# Patient Record
Sex: Female | Born: 1950
Health system: Southern US, Community
[De-identification: ages and names within clinical notes are randomized; demographics above are authoritative.]

## PROBLEM LIST (undated history)

## (undated) DIAGNOSIS — E119 Type 2 diabetes mellitus without complications: Secondary | ICD-10-CM

## (undated) DIAGNOSIS — E7800 Pure hypercholesterolemia, unspecified: Secondary | ICD-10-CM

## (undated) DIAGNOSIS — M1990 Unspecified osteoarthritis, unspecified site: Secondary | ICD-10-CM

## (undated) DIAGNOSIS — I10 Essential (primary) hypertension: Secondary | ICD-10-CM

## (undated) HISTORY — PX: COLONOSCOPY: SHX174

## (undated) HISTORY — PX: CLOSED REDUCTION / MANIPULATION JOINT: SUR207

---

## 2001-10-12 ENCOUNTER — Other Ambulatory Visit: Admission: RE | Admit: 2001-10-12 | Discharge: 2001-10-12 | Payer: Self-pay | Admitting: Obstetrics and Gynecology

## 2002-11-20 ENCOUNTER — Other Ambulatory Visit: Admission: RE | Admit: 2002-11-20 | Discharge: 2002-11-20 | Payer: Self-pay | Admitting: Obstetrics and Gynecology

## 2002-11-22 ENCOUNTER — Encounter: Admission: RE | Admit: 2002-11-22 | Discharge: 2002-11-22 | Payer: Self-pay | Admitting: Family Medicine

## 2002-11-22 ENCOUNTER — Encounter: Payer: Self-pay | Admitting: Family Medicine

## 2002-12-19 ENCOUNTER — Encounter: Payer: Self-pay | Admitting: Obstetrics and Gynecology

## 2002-12-19 ENCOUNTER — Ambulatory Visit (HOSPITAL_COMMUNITY): Admission: RE | Admit: 2002-12-19 | Discharge: 2002-12-19 | Payer: Self-pay | Admitting: Obstetrics and Gynecology

## 2004-12-17 ENCOUNTER — Emergency Department (HOSPITAL_COMMUNITY): Admission: EM | Admit: 2004-12-17 | Discharge: 2004-12-18 | Payer: Self-pay | Admitting: Emergency Medicine

## 2007-02-16 ENCOUNTER — Emergency Department: Payer: Self-pay | Admitting: Emergency Medicine

## 2007-02-16 IMAGING — US ABDOMEN ULTRASOUND
1 series · 17 of 25 positions shown · non-contrast
Comparison: none

REASON FOR EXAM: ruq pain vomiting
COMMENTS:

PROCEDURE:     US  - US ABDOMEN GENERAL SURVEY  - [DATE] [DATE]
RESULT:     Comparison: No available comparison exam.

[Series 1: abdomen ultrasound · 17 of 54 slices shown]
[im 1/54]
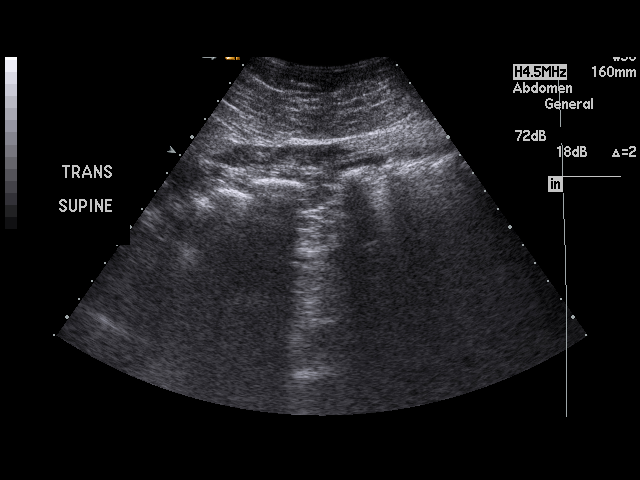
[im 5/54]
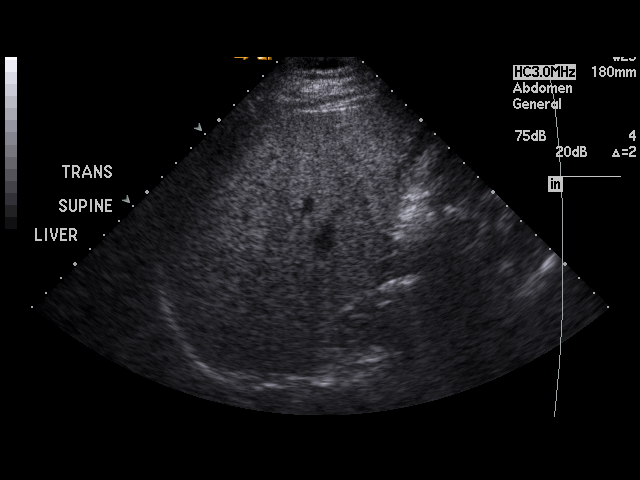
[im 7/54]
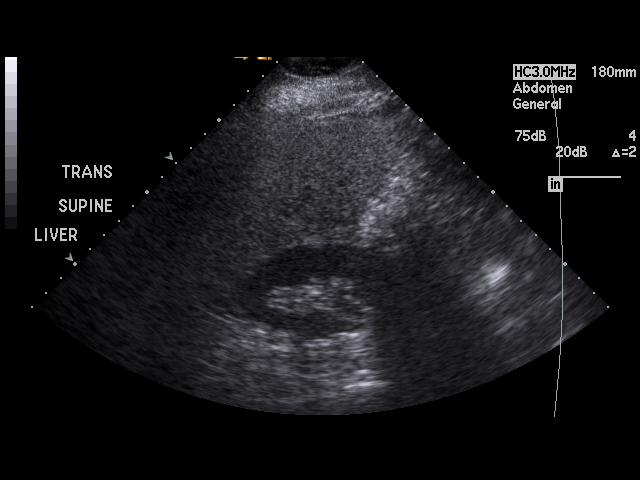
[im 12/54]
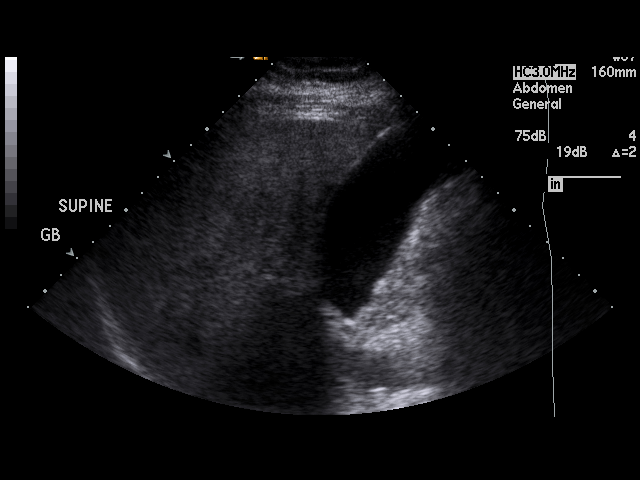
[im 14/54]
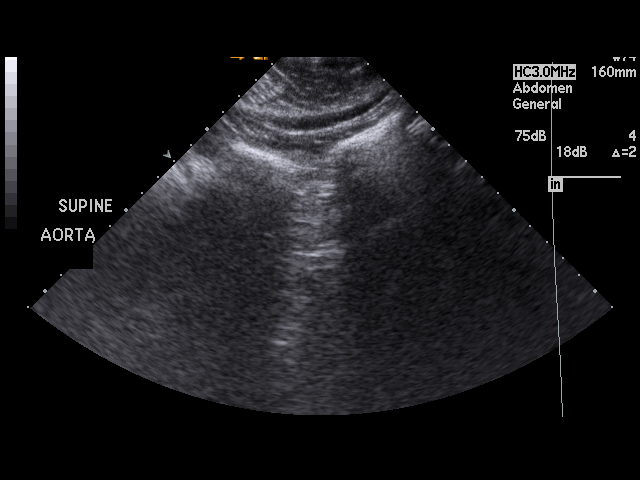
[im 18/54]
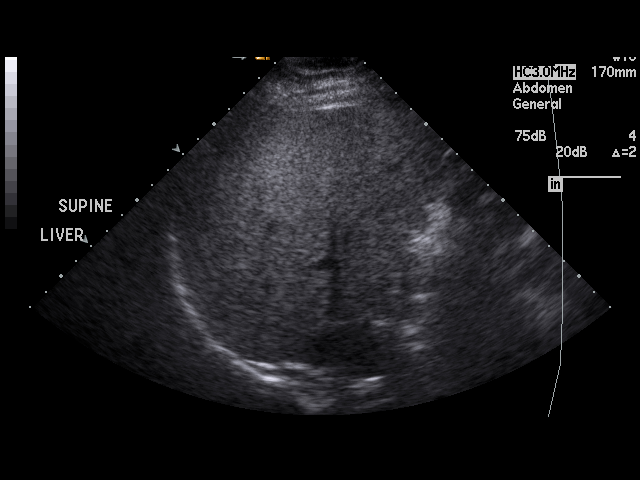
[im 20/54]
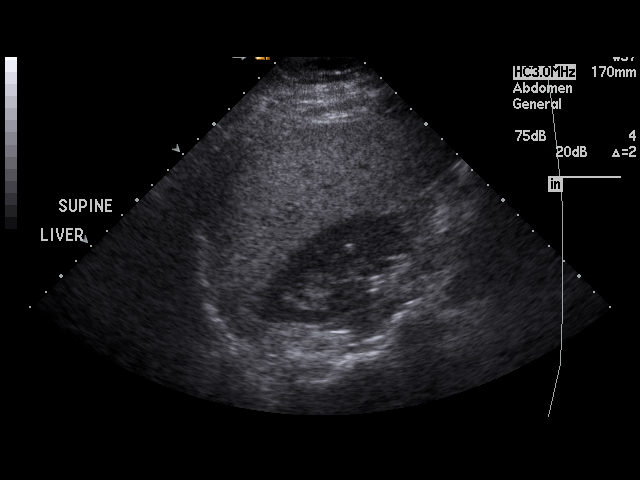
[im 25/54]
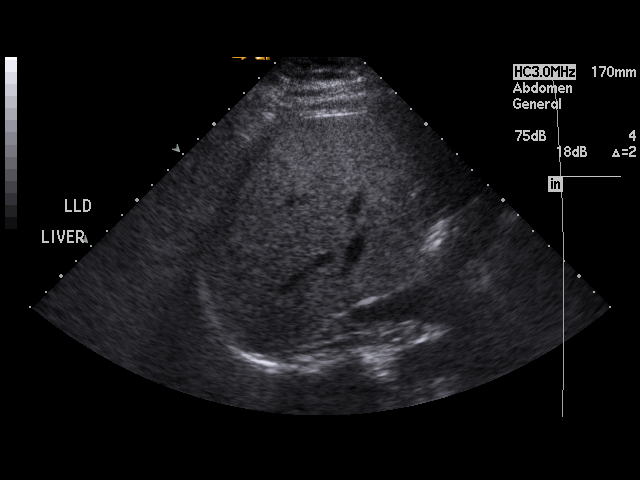
[im 27/54]
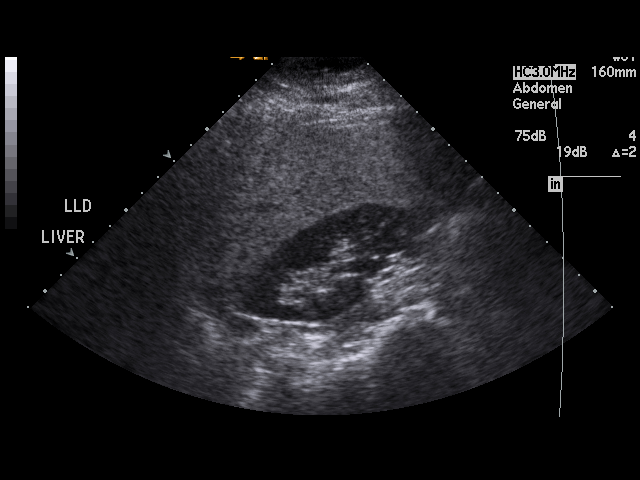
[im 29/54]
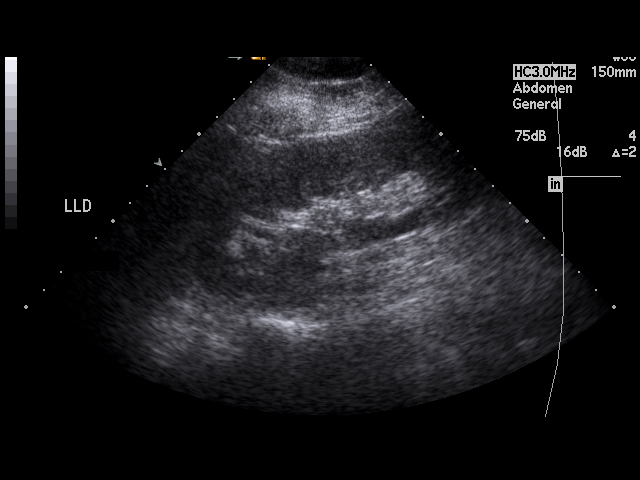
[im 34/54]
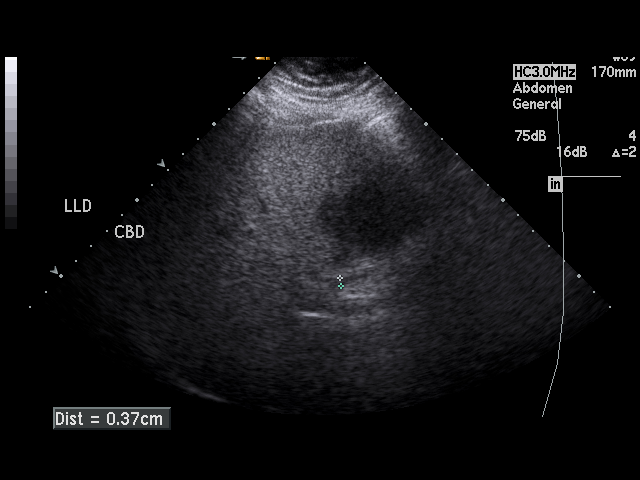
[im 36/54]
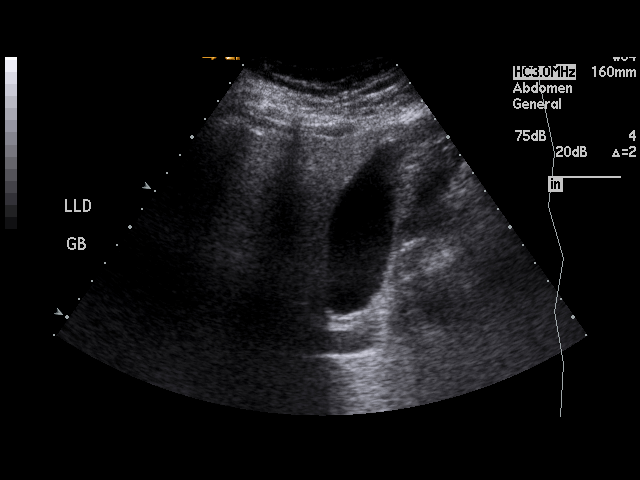
[im 40/54]
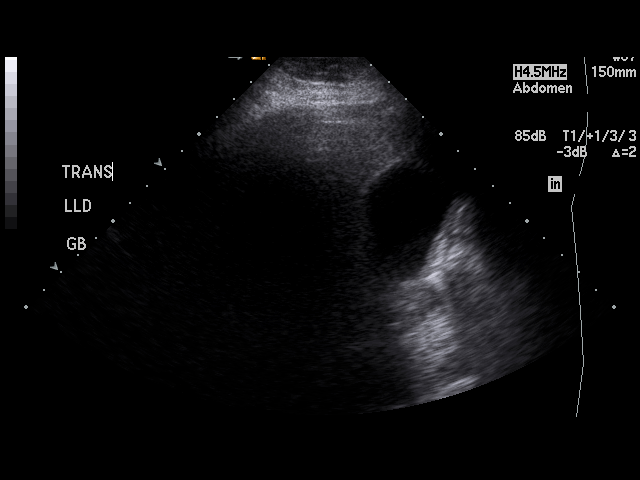
[im 42/54]
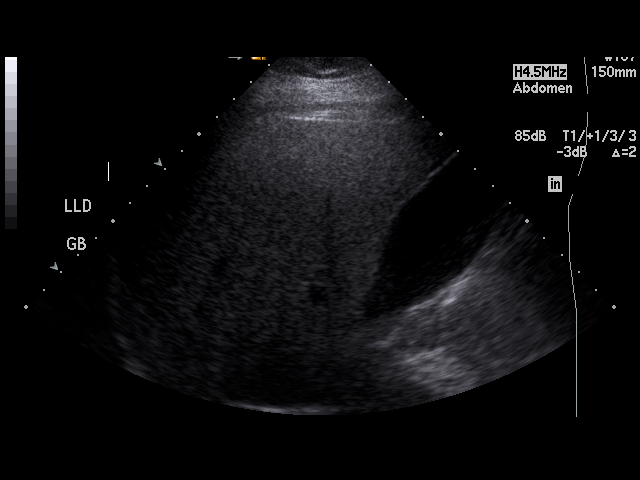
[im 47/54]
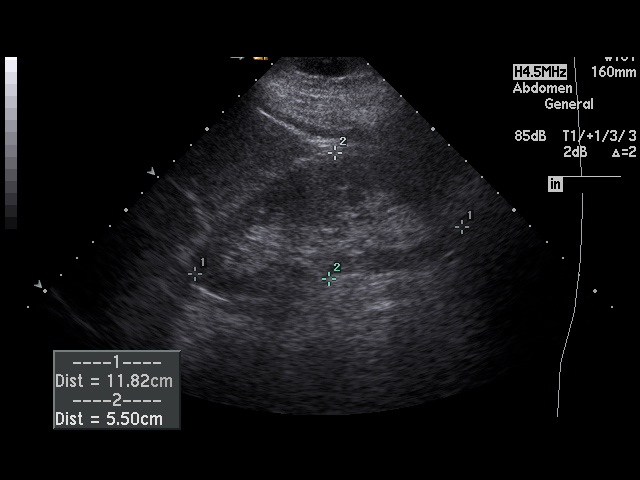
[im 49/54]
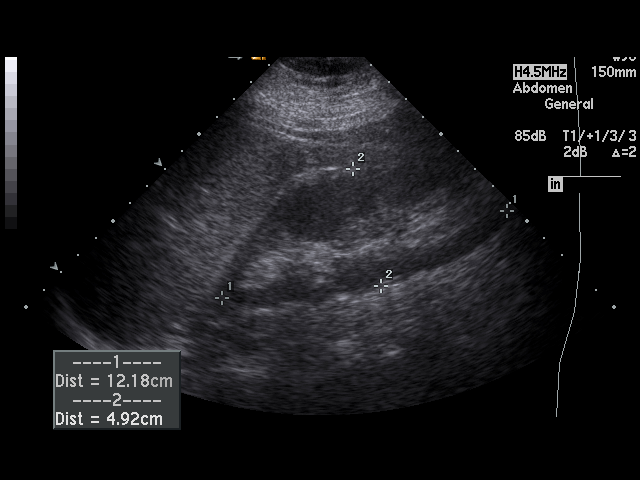
[im 54/54]
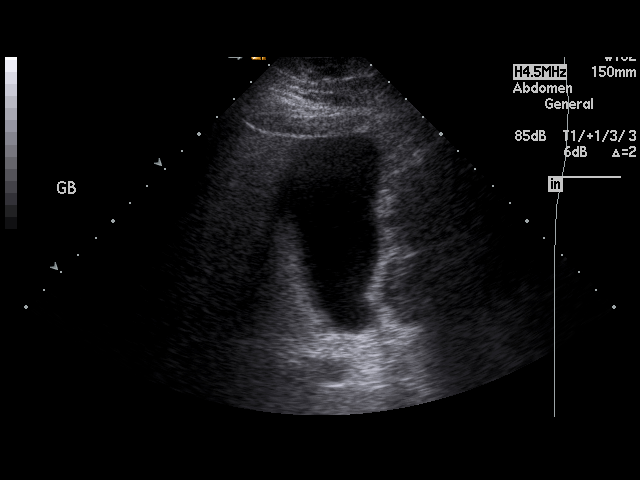

[17 of 25 positions shown; findings below may reference images not displayed]

FINDINGS: The liver is diffusely increased in echogenicity, suggestive of fatty
infiltration. No gross focal mass is noted. There is no intra or
extrahepatic bile duct dilatation. The common duct measures 4 mm in
diameter. The gallbladder is unremarkable. There is no sonographic Murphy's
sign. The main portal vein is patent with hepatopedal flow.

The pancreas is not well visualized. The right kidney measures 12.0 cm and
the left measures 12.8 cm in length. There is normal corticomedullary
differentiation. No definite renal stone or mass is noted. There is no
hydronephrosis. The spleen measures 10.8 cm in length. The abdominal aorta
is not well visualized.
IMPRESSION: 1. The pancreas and abdominal aorta are not well visualized.
2. Diffuse fatty infiltration of the liver.

Preliminary report was faxed to the emergency room by the night radiologist
shortly after the study was performed.

## 2007-03-04 ENCOUNTER — Ambulatory Visit (HOSPITAL_COMMUNITY): Admission: RE | Admit: 2007-03-04 | Discharge: 2007-03-04 | Payer: Self-pay | Admitting: Internal Medicine

## 2007-06-04 ENCOUNTER — Ambulatory Visit: Payer: Self-pay | Admitting: Orthopaedic Surgery

## 2008-07-03 ENCOUNTER — Ambulatory Visit: Payer: Self-pay | Admitting: Internal Medicine

## 2009-03-07 IMAGING — MG MAM DGTL SCREENING MAMMO W/CAD
1 series · 4 of 4 positions shown · non-contrast
Comparison: none

REASON FOR EXAM: scr mammo
COMMENTS:

[R CC · right · 4 of 4 slices shown]
[im 1/4]
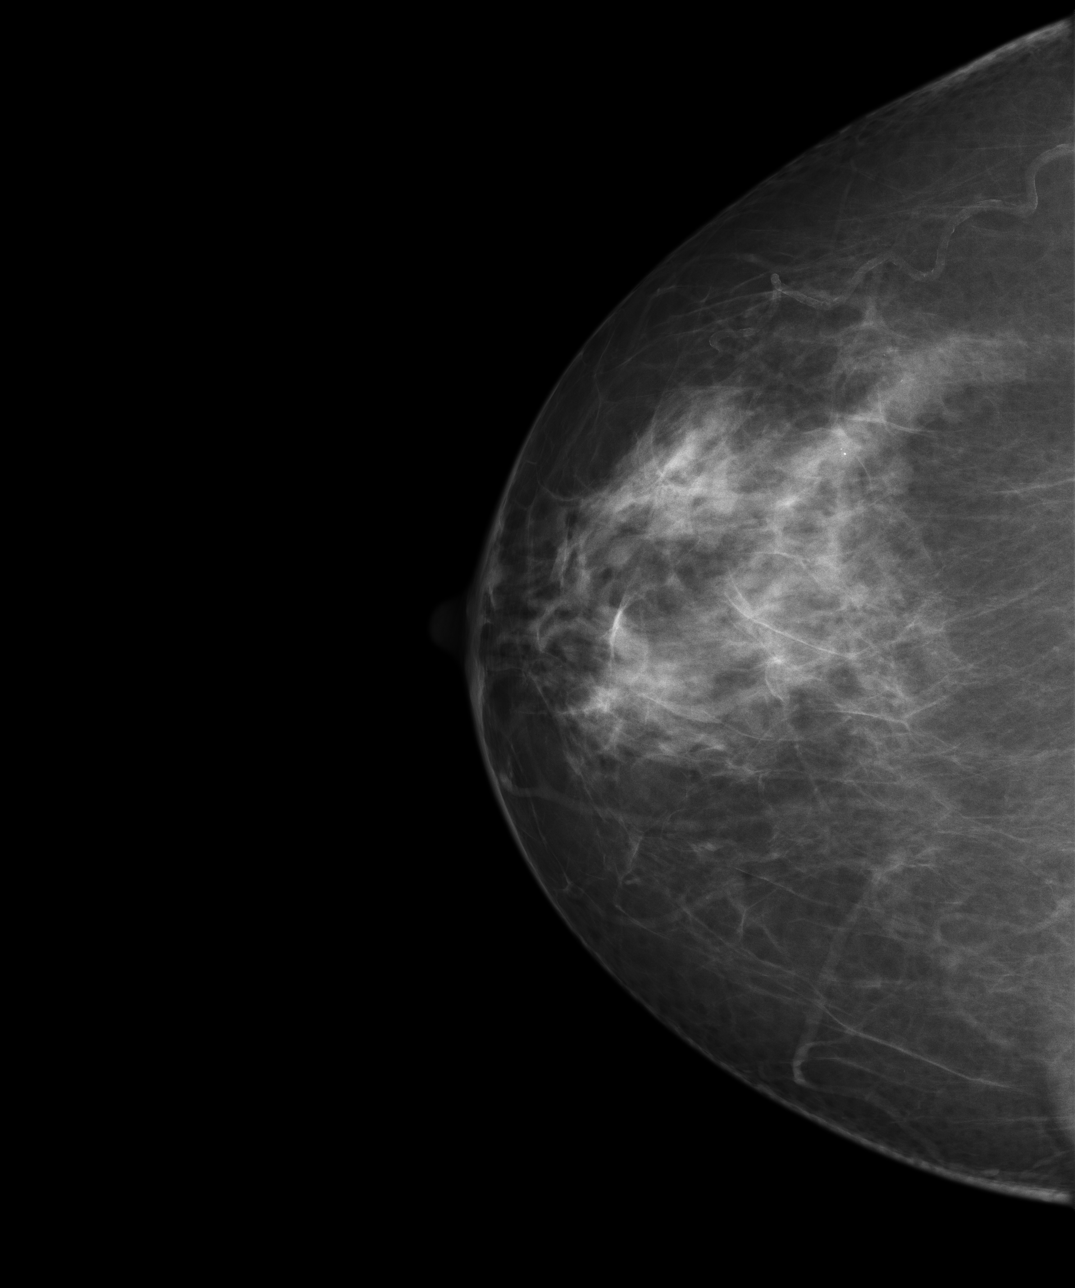
[im 2/4]
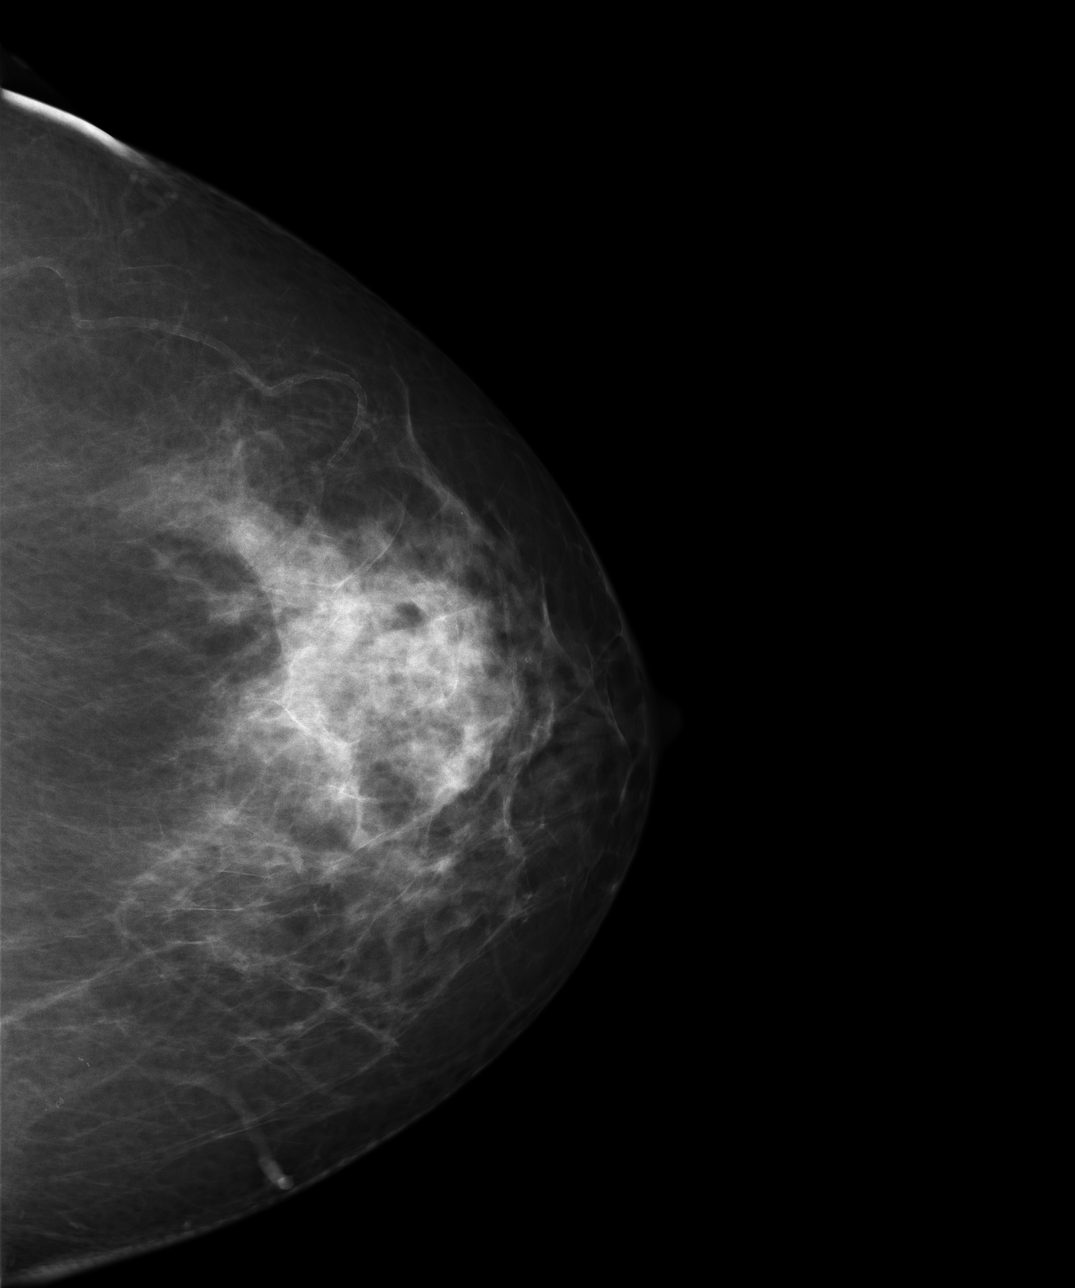
[im 3/4]
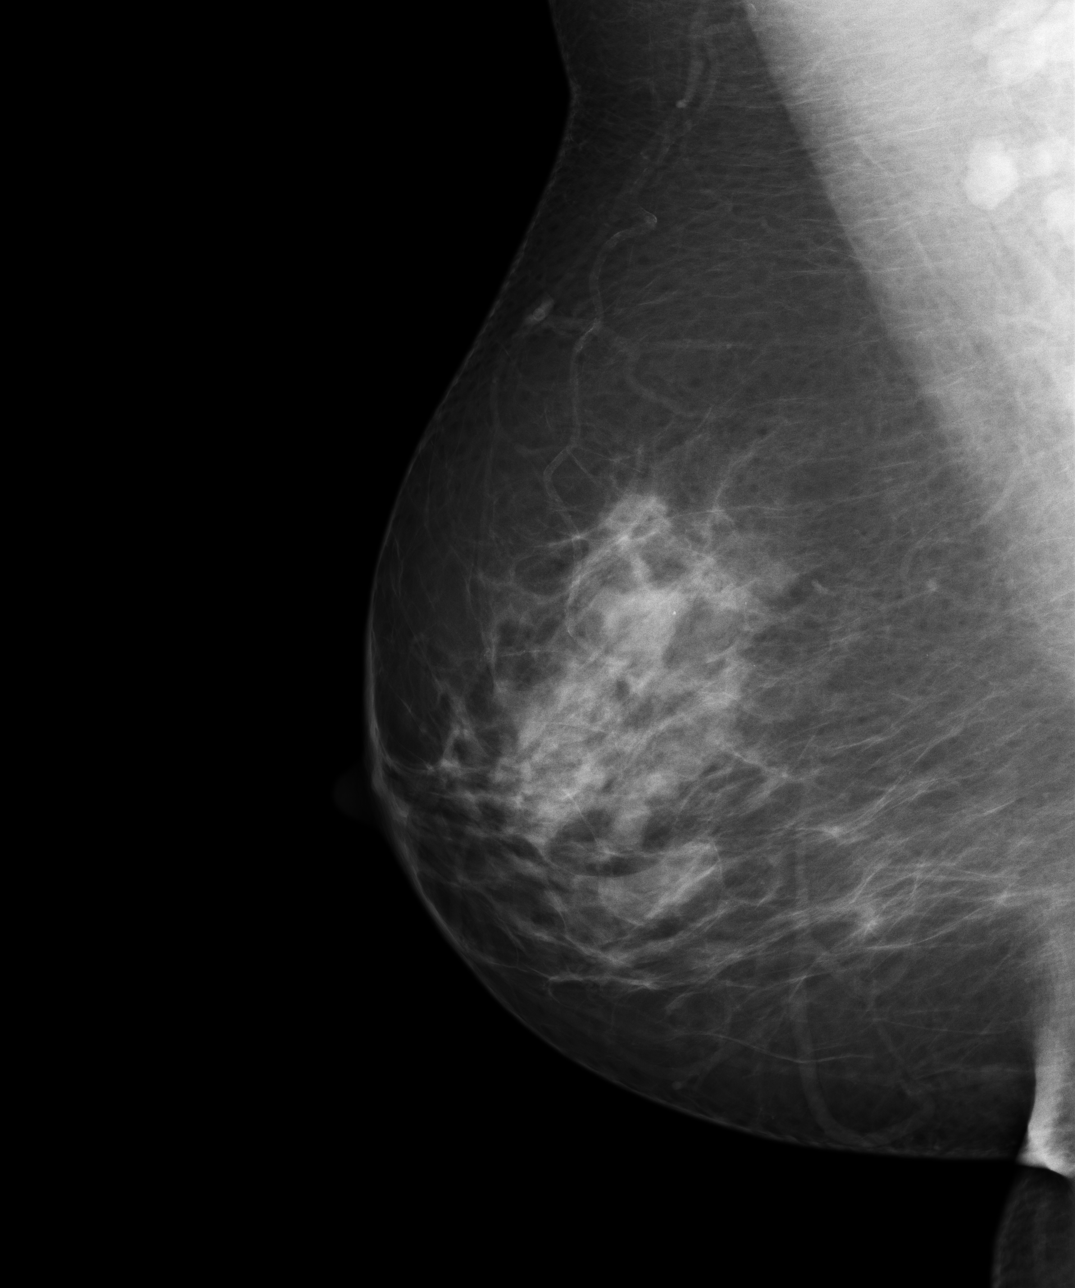
[im 4/4]
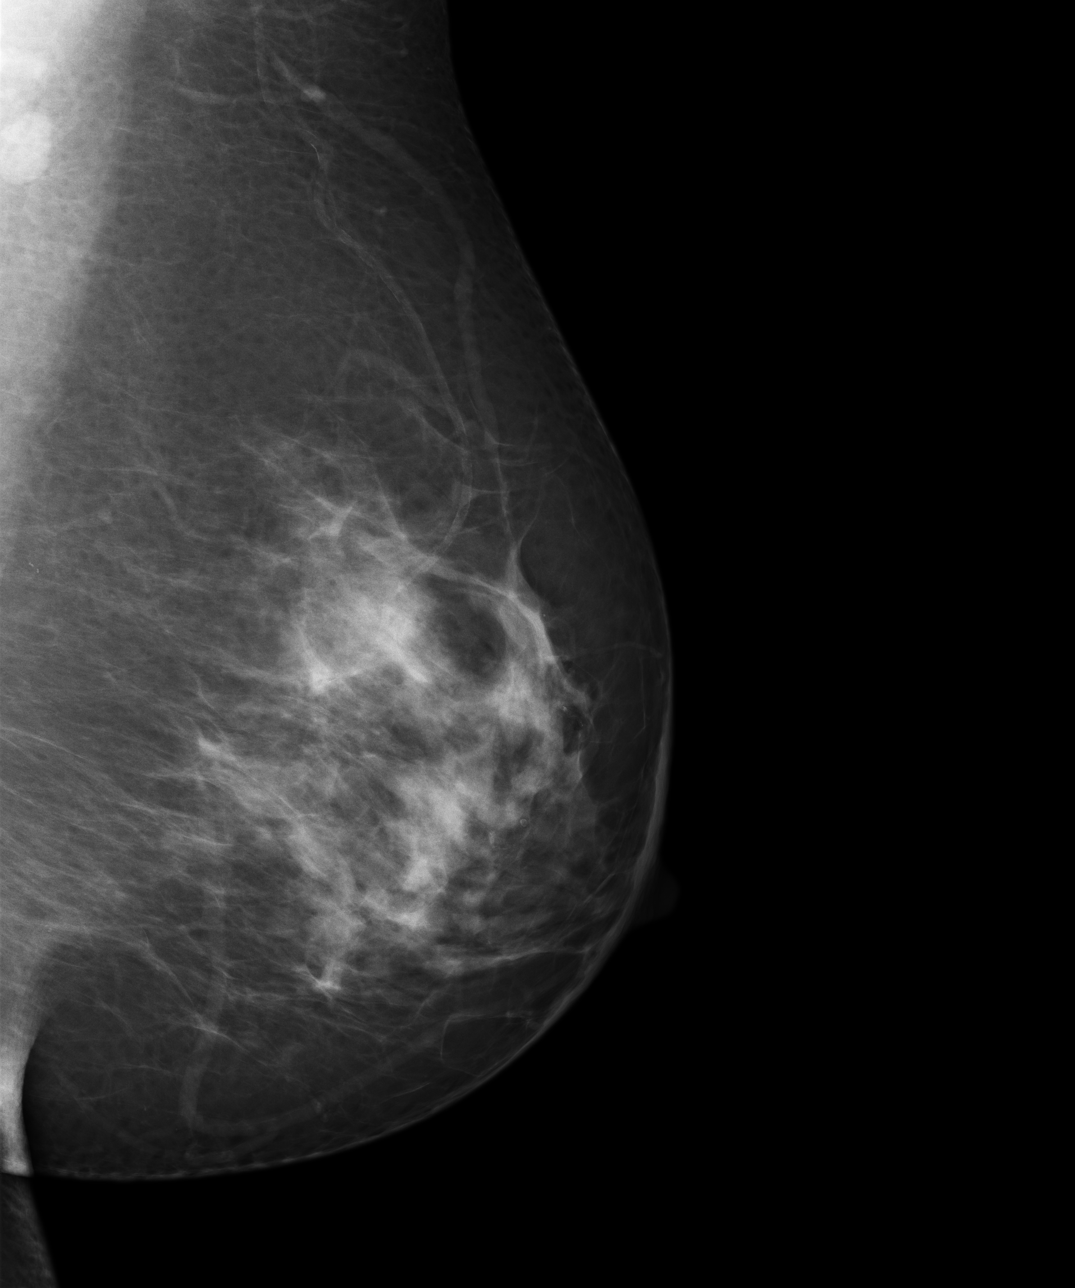

[4 of 4 positions shown; findings below may reference images not displayed]

PROCEDURE:     MAM - MAM DGTL SCREENING MAMMO W/CAD  - July 03, 2008  [DATE]

RESULT:        Comparison is made to the film screen images dated 12/19/02
from [REDACTED] and to digital images of 03/04/07.

The breasts exhibit a moderately dense parenchymal pattern. There is no
evidence of an area of developing density, dominant mass or
malignant-appearing calcification.  The appearance  is stable.
IMPRESSION: 1.     Stable, benign-appearing bilateral mammogram.
2.     BI-RADS:  Category 2-Benign Finding.
3.     Please continue to encourage annual mammographic followup and monthly
breast self exam.

A NEGATIVE MAMMOGRAM REPORT DOES NOT PRECLUDE BIOPSY OR OTHER EVALUATION OF
A CLINICALLY PALPABLE OR OTHERWISE SUSPICIOUS MASS OR LESION.  BREAST CANCER
MAY NOT BE DETECTED BY MAMMOGRAPHY IN UP TO 10% OF CASES.

## 2009-04-23 ENCOUNTER — Ambulatory Visit: Payer: Self-pay | Admitting: Internal Medicine

## 2009-10-31 ENCOUNTER — Emergency Department: Payer: Self-pay | Admitting: Emergency Medicine

## 2010-06-15 ENCOUNTER — Encounter: Payer: Self-pay | Admitting: Internal Medicine

## 2017-01-08 DIAGNOSIS — I10 Essential (primary) hypertension: Secondary | ICD-10-CM | POA: Diagnosis not present

## 2017-01-08 DIAGNOSIS — E1165 Type 2 diabetes mellitus with hyperglycemia: Secondary | ICD-10-CM | POA: Diagnosis not present

## 2017-01-08 DIAGNOSIS — K219 Gastro-esophageal reflux disease without esophagitis: Secondary | ICD-10-CM | POA: Diagnosis not present

## 2017-01-08 DIAGNOSIS — E668 Other obesity: Secondary | ICD-10-CM | POA: Diagnosis not present

## 2017-01-08 DIAGNOSIS — Z79899 Other long term (current) drug therapy: Secondary | ICD-10-CM | POA: Diagnosis not present

## 2017-01-08 DIAGNOSIS — E784 Other hyperlipidemia: Secondary | ICD-10-CM | POA: Diagnosis not present

## 2017-01-20 ENCOUNTER — Ambulatory Visit: Payer: Self-pay | Admitting: Family Medicine

## 2017-03-06 DIAGNOSIS — Z23 Encounter for immunization: Secondary | ICD-10-CM | POA: Diagnosis not present

## 2017-03-22 DIAGNOSIS — E7849 Other hyperlipidemia: Secondary | ICD-10-CM | POA: Diagnosis not present

## 2017-03-22 DIAGNOSIS — D259 Leiomyoma of uterus, unspecified: Secondary | ICD-10-CM | POA: Diagnosis present

## 2017-03-22 DIAGNOSIS — Z7984 Long term (current) use of oral hypoglycemic drugs: Secondary | ICD-10-CM | POA: Diagnosis not present

## 2017-03-22 DIAGNOSIS — D62 Acute posthemorrhagic anemia: Secondary | ICD-10-CM | POA: Diagnosis not present

## 2017-03-22 DIAGNOSIS — Z79899 Other long term (current) drug therapy: Secondary | ICD-10-CM | POA: Diagnosis not present

## 2017-03-22 DIAGNOSIS — K5751 Diverticulosis of both small and large intestine without perforation or abscess with bleeding: Secondary | ICD-10-CM | POA: Diagnosis not present

## 2017-03-22 DIAGNOSIS — K760 Fatty (change of) liver, not elsewhere classified: Secondary | ICD-10-CM | POA: Diagnosis not present

## 2017-03-22 DIAGNOSIS — E119 Type 2 diabetes mellitus without complications: Secondary | ICD-10-CM | POA: Diagnosis not present

## 2017-03-22 DIAGNOSIS — K922 Gastrointestinal hemorrhage, unspecified: Secondary | ICD-10-CM | POA: Diagnosis not present

## 2017-03-22 DIAGNOSIS — E785 Hyperlipidemia, unspecified: Secondary | ICD-10-CM | POA: Diagnosis present

## 2017-03-22 DIAGNOSIS — K5790 Diverticulosis of intestine, part unspecified, without perforation or abscess without bleeding: Secondary | ICD-10-CM | POA: Diagnosis not present

## 2017-03-22 DIAGNOSIS — Z87891 Personal history of nicotine dependence: Secondary | ICD-10-CM | POA: Diagnosis not present

## 2017-03-22 DIAGNOSIS — I10 Essential (primary) hypertension: Secondary | ICD-10-CM | POA: Diagnosis not present

## 2017-04-09 DIAGNOSIS — I10 Essential (primary) hypertension: Secondary | ICD-10-CM | POA: Diagnosis not present

## 2017-04-09 DIAGNOSIS — E119 Type 2 diabetes mellitus without complications: Secondary | ICD-10-CM | POA: Diagnosis not present

## 2017-04-09 DIAGNOSIS — K5730 Diverticulosis of large intestine without perforation or abscess without bleeding: Secondary | ICD-10-CM | POA: Diagnosis not present

## 2017-04-09 DIAGNOSIS — Z0000 Encounter for general adult medical examination without abnormal findings: Secondary | ICD-10-CM | POA: Diagnosis not present

## 2017-07-20 DIAGNOSIS — I10 Essential (primary) hypertension: Secondary | ICD-10-CM | POA: Diagnosis not present

## 2017-07-20 DIAGNOSIS — E119 Type 2 diabetes mellitus without complications: Secondary | ICD-10-CM | POA: Diagnosis not present

## 2017-07-20 DIAGNOSIS — K5730 Diverticulosis of large intestine without perforation or abscess without bleeding: Secondary | ICD-10-CM | POA: Diagnosis not present

## 2017-07-20 DIAGNOSIS — Z0000 Encounter for general adult medical examination without abnormal findings: Secondary | ICD-10-CM | POA: Diagnosis not present

## 2017-10-27 DIAGNOSIS — E119 Type 2 diabetes mellitus without complications: Secondary | ICD-10-CM | POA: Diagnosis not present

## 2017-10-27 DIAGNOSIS — E782 Mixed hyperlipidemia: Secondary | ICD-10-CM | POA: Diagnosis not present

## 2017-10-27 DIAGNOSIS — I10 Essential (primary) hypertension: Secondary | ICD-10-CM | POA: Diagnosis not present

## 2018-02-07 DIAGNOSIS — R69 Illness, unspecified: Secondary | ICD-10-CM | POA: Diagnosis not present

## 2018-02-09 DIAGNOSIS — I10 Essential (primary) hypertension: Secondary | ICD-10-CM | POA: Diagnosis not present

## 2018-02-09 DIAGNOSIS — S86211A Strain of muscle(s) and tendon(s) of anterior muscle group at lower leg level, right leg, initial encounter: Secondary | ICD-10-CM | POA: Diagnosis not present

## 2018-02-09 DIAGNOSIS — E782 Mixed hyperlipidemia: Secondary | ICD-10-CM | POA: Diagnosis not present

## 2018-02-09 DIAGNOSIS — E119 Type 2 diabetes mellitus without complications: Secondary | ICD-10-CM | POA: Diagnosis not present

## 2018-03-26 DIAGNOSIS — R69 Illness, unspecified: Secondary | ICD-10-CM | POA: Diagnosis not present

## 2018-05-04 DIAGNOSIS — Z0000 Encounter for general adult medical examination without abnormal findings: Secondary | ICD-10-CM | POA: Diagnosis not present

## 2018-05-04 DIAGNOSIS — M25461 Effusion, right knee: Secondary | ICD-10-CM | POA: Diagnosis not present

## 2018-05-04 DIAGNOSIS — I10 Essential (primary) hypertension: Secondary | ICD-10-CM | POA: Diagnosis not present

## 2018-05-04 DIAGNOSIS — E119 Type 2 diabetes mellitus without complications: Secondary | ICD-10-CM | POA: Diagnosis not present

## 2018-05-04 DIAGNOSIS — M170 Bilateral primary osteoarthritis of knee: Secondary | ICD-10-CM | POA: Diagnosis not present

## 2018-05-04 DIAGNOSIS — Z6835 Body mass index (BMI) 35.0-35.9, adult: Secondary | ICD-10-CM | POA: Diagnosis not present

## 2018-05-04 DIAGNOSIS — E782 Mixed hyperlipidemia: Secondary | ICD-10-CM | POA: Diagnosis not present

## 2018-05-04 DIAGNOSIS — S86211A Strain of muscle(s) and tendon(s) of anterior muscle group at lower leg level, right leg, initial encounter: Secondary | ICD-10-CM | POA: Diagnosis not present

## 2018-05-04 DIAGNOSIS — M25561 Pain in right knee: Secondary | ICD-10-CM | POA: Diagnosis not present

## 2018-05-04 DIAGNOSIS — M25562 Pain in left knee: Secondary | ICD-10-CM | POA: Diagnosis not present

## 2018-05-14 DIAGNOSIS — R69 Illness, unspecified: Secondary | ICD-10-CM | POA: Diagnosis not present

## 2018-09-08 DIAGNOSIS — I10 Essential (primary) hypertension: Secondary | ICD-10-CM | POA: Diagnosis not present

## 2018-09-08 DIAGNOSIS — E119 Type 2 diabetes mellitus without complications: Secondary | ICD-10-CM | POA: Diagnosis not present

## 2018-09-08 DIAGNOSIS — Z6836 Body mass index (BMI) 36.0-36.9, adult: Secondary | ICD-10-CM | POA: Diagnosis not present

## 2018-09-08 DIAGNOSIS — E782 Mixed hyperlipidemia: Secondary | ICD-10-CM | POA: Diagnosis not present

## 2018-10-03 DIAGNOSIS — R69 Illness, unspecified: Secondary | ICD-10-CM | POA: Diagnosis not present

## 2018-11-22 DIAGNOSIS — R69 Illness, unspecified: Secondary | ICD-10-CM | POA: Diagnosis not present

## 2018-11-23 DIAGNOSIS — R69 Illness, unspecified: Secondary | ICD-10-CM | POA: Diagnosis not present

## 2018-12-05 DIAGNOSIS — E782 Mixed hyperlipidemia: Secondary | ICD-10-CM | POA: Diagnosis not present

## 2018-12-05 DIAGNOSIS — Z6837 Body mass index (BMI) 37.0-37.9, adult: Secondary | ICD-10-CM | POA: Diagnosis not present

## 2018-12-05 DIAGNOSIS — I10 Essential (primary) hypertension: Secondary | ICD-10-CM | POA: Diagnosis not present

## 2018-12-05 DIAGNOSIS — E119 Type 2 diabetes mellitus without complications: Secondary | ICD-10-CM | POA: Diagnosis not present

## 2018-12-05 DIAGNOSIS — K921 Melena: Secondary | ICD-10-CM | POA: Diagnosis not present

## 2018-12-09 DIAGNOSIS — E119 Type 2 diabetes mellitus without complications: Secondary | ICD-10-CM | POA: Diagnosis not present

## 2018-12-09 DIAGNOSIS — E785 Hyperlipidemia, unspecified: Secondary | ICD-10-CM | POA: Diagnosis not present

## 2018-12-19 DIAGNOSIS — K921 Melena: Secondary | ICD-10-CM | POA: Insufficient documentation

## 2018-12-31 DIAGNOSIS — R69 Illness, unspecified: Secondary | ICD-10-CM | POA: Diagnosis not present

## 2019-01-24 DIAGNOSIS — Z6836 Body mass index (BMI) 36.0-36.9, adult: Secondary | ICD-10-CM | POA: Diagnosis not present

## 2019-01-24 DIAGNOSIS — M25761 Osteophyte, right knee: Secondary | ICD-10-CM | POA: Diagnosis not present

## 2019-02-02 ENCOUNTER — Ambulatory Visit (INDEPENDENT_AMBULATORY_CARE_PROVIDER_SITE_OTHER): Payer: Self-pay | Admitting: Orthopaedic Surgery

## 2019-02-02 ENCOUNTER — Encounter: Payer: Self-pay | Admitting: Orthopaedic Surgery

## 2019-02-02 ENCOUNTER — Other Ambulatory Visit: Payer: Self-pay

## 2019-02-02 VITALS — BP 146/84 | HR 80 | Ht 67.0 in | Wt 235.0 lb

## 2019-02-02 DIAGNOSIS — M170 Bilateral primary osteoarthritis of knee: Secondary | ICD-10-CM

## 2019-02-02 DIAGNOSIS — Z6836 Body mass index (BMI) 36.0-36.9, adult: Secondary | ICD-10-CM

## 2019-02-02 NOTE — Progress Notes (Signed)
Office Visit Note   Patient: April Murillo           Date of Birth: Jul 17, 1950           MRN: [id number] Visit Date: 02/02/2019              Requested by: No referring provider defined for this encounter. PCP: Patient, No Pcp Per   Assessment & Plan: Visit Diagnoses:  1. Body mass index 36.0-36.9, adult   2. Bilateral primary osteoarthritis of knee     Plan: Reviewed x-rays she has tricompartmental degenerative arthritis.  We discussed riding exercise bike for cardiovascular fitness which should not bother her needs as much is walking she can walk some but will do better biking.  I will recheck her again in 3 months.  Currently she is doing well enough after the intra-articular injection that she needs no further treatment.  We discussed occasional anti-inflammatories.  We discussed GI problems with chronic anti-inflammatory use and potential for renal problems particular in patients of diabetes.  Follow-Up Instructions: Return in about 3 months (around 05/04/2019).   Orders:  No orders of the defined types were placed in this encounter.  No orders of the defined types were placed in this encounter.     Procedures: No procedures performed   Clinical Data: No additional findings.   Subjective: Chief Complaint  Patient presents with   Right Knee - Pain   Left Knee - Pain    HPI 68 year old female with bilateral knee pain worse in the right than left knee.  Patient states she has had popping difficulty with stairs for several years but her knee symptoms have been worse in the last 7 months.  She got an injection by Dr.Hasanaj and she states her knee feels much better she is ambulating better not limping.  She does have some diabetes takes metformin also simvastatin for cholesterol and blood pressure medication.  Patient's been on Celebrex in the past, ibuprofen, Tylenol arthritis.  Review of Systems positive for hypertension acid reflux diabetes.  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  Obesity BMI 36.   Objective: Vital Signs: BP (!) 146/84    Pulse 80    Ht 5\' 7"  (1.702 m)    Wt 235 lb (106.6 kg)    BMI 36.81 kg/m   Physical Exam Constitutional:      Appearance: She is well-developed.  HENT:     Head: Normocephalic.     Right Ear: External ear normal.     Left Ear: External ear normal.  Eyes:     Pupils: Pupils are equal, round, and reactive to light.  Neck:     Thyroid: No thyromegaly.     Trachea: No tracheal deviation.  Cardiovascular:     Rate and Rhythm: Normal rate.  Pulmonary:     Effort: Pulmonary effort is normal.  Abdominal:     Palpations: Abdomen is soft.  Skin:    General: Skin is warm and dry.  Neurological:     Mental Status: She is alert and oriented to person, place, and time.  Psychiatric:        Behavior: Behavior normal.     Ortho Exam positive bilateral crepitus both knees.  Negative logroll the hips negative straight leg raising no popliteal masses collateral ligaments are stable.  Crepitus with patellofemoral loading and quadriceps contracture.  Distal pulses are 2+.  No pitting edema.  Specialty Comments:  No specialty comments available.  Imaging:  Right and left knee x-rays demonstrate tricompartmental degenerative changes with more medial joint space narrowing and spurring 50% narrowing right and left knee.   PMFS History: Patient Active Problem List   Diagnosis Date Noted   Bilateral primary osteoarthritis of knee 02/02/2019   Body mass index 36.0-36.9, adult 02/02/2019   History reviewed. No pertinent past medical history.  History reviewed. No pertinent family history.  History reviewed. No pertinent surgical history. Social History   Occupational History   Not on file  Tobacco Use   Smoking status: Never Smoker   Smokeless tobacco: Never Used  Substance and Sexual Activity   Alcohol use: Not on file   Drug use: Not on file   Sexual activity: Not on file

## 2019-02-22 DIAGNOSIS — R69 Illness, unspecified: Secondary | ICD-10-CM | POA: Diagnosis not present

## 2019-04-12 DIAGNOSIS — R69 Illness, unspecified: Secondary | ICD-10-CM | POA: Diagnosis not present

## 2019-04-25 DIAGNOSIS — Z6836 Body mass index (BMI) 36.0-36.9, adult: Secondary | ICD-10-CM | POA: Diagnosis not present

## 2019-04-25 DIAGNOSIS — K219 Gastro-esophageal reflux disease without esophagitis: Secondary | ICD-10-CM | POA: Diagnosis not present

## 2019-04-25 DIAGNOSIS — E782 Mixed hyperlipidemia: Secondary | ICD-10-CM | POA: Diagnosis not present

## 2019-04-25 DIAGNOSIS — E119 Type 2 diabetes mellitus without complications: Secondary | ICD-10-CM | POA: Diagnosis not present

## 2019-04-25 DIAGNOSIS — M25761 Osteophyte, right knee: Secondary | ICD-10-CM | POA: Diagnosis not present

## 2019-04-25 DIAGNOSIS — I10 Essential (primary) hypertension: Secondary | ICD-10-CM | POA: Diagnosis not present

## 2019-05-01 DIAGNOSIS — R1084 Generalized abdominal pain: Secondary | ICD-10-CM | POA: Diagnosis not present

## 2019-05-01 DIAGNOSIS — K760 Fatty (change of) liver, not elsewhere classified: Secondary | ICD-10-CM | POA: Diagnosis not present

## 2019-05-04 ENCOUNTER — Ambulatory Visit: Payer: Self-pay | Admitting: Orthopaedic Surgery

## 2019-05-04 ENCOUNTER — Other Ambulatory Visit: Payer: Self-pay

## 2019-06-02 DIAGNOSIS — R69 Illness, unspecified: Secondary | ICD-10-CM | POA: Diagnosis not present

## 2019-07-20 DIAGNOSIS — R69 Illness, unspecified: Secondary | ICD-10-CM | POA: Diagnosis not present

## 2019-07-21 ENCOUNTER — Ambulatory Visit: Payer: Medicare HMO | Attending: Internal Medicine

## 2019-07-26 DIAGNOSIS — E119 Type 2 diabetes mellitus without complications: Secondary | ICD-10-CM | POA: Diagnosis not present

## 2019-07-26 DIAGNOSIS — Z0000 Encounter for general adult medical examination without abnormal findings: Secondary | ICD-10-CM | POA: Diagnosis not present

## 2019-07-26 DIAGNOSIS — M25562 Pain in left knee: Secondary | ICD-10-CM | POA: Diagnosis not present

## 2019-07-26 DIAGNOSIS — Z1331 Encounter for screening for depression: Secondary | ICD-10-CM | POA: Diagnosis not present

## 2019-07-26 DIAGNOSIS — M25561 Pain in right knee: Secondary | ICD-10-CM | POA: Diagnosis not present

## 2019-07-26 DIAGNOSIS — Z6835 Body mass index (BMI) 35.0-35.9, adult: Secondary | ICD-10-CM | POA: Diagnosis not present

## 2019-07-26 DIAGNOSIS — K219 Gastro-esophageal reflux disease without esophagitis: Secondary | ICD-10-CM | POA: Diagnosis not present

## 2019-07-26 DIAGNOSIS — I10 Essential (primary) hypertension: Secondary | ICD-10-CM | POA: Diagnosis not present

## 2019-07-26 DIAGNOSIS — E782 Mixed hyperlipidemia: Secondary | ICD-10-CM | POA: Diagnosis not present

## 2019-08-03 DIAGNOSIS — H25813 Combined forms of age-related cataract, bilateral: Secondary | ICD-10-CM | POA: Diagnosis not present

## 2019-08-28 DIAGNOSIS — M25561 Pain in right knee: Secondary | ICD-10-CM | POA: Diagnosis not present

## 2019-08-28 DIAGNOSIS — Z6835 Body mass index (BMI) 35.0-35.9, adult: Secondary | ICD-10-CM | POA: Diagnosis not present

## 2019-09-06 NOTE — H&P (Signed)
Surgical History & Physical  Patient Name: April Murillo DOB: 06/11/1950  Surgery: Cataract extraction with intraocular lens implant phacoemulsification; Right Eye  Surgeon: Baruch Goldmann MD Surgery Date:  09/18/2019 Pre-Op Date:  09/06/2019  HPI: A 42 Yr. old female patient 1. The patient complains of difficulty when viewing TV, reading closed caption, news scrolls on TV, which began 2 years ago. Both eyes are affected, OD>OS. The episode is gradual. The patient describes hazy symptoms affecting their eyes/vision. Pt has a lot of glare with lights. Pt is needing more light to read. No eye discomfort. Last A1C was 6.5. HPI was performed by Baruch Goldmann .  Medical History: Cataracts Diabetes High Blood Pressure LDL  Review of Systems Negative Allergic/Immunologic Negative Cardiovascular Negative Constitutional Negative Ear, Nose, Mouth & Throat Negative Endocrine Negative Eyes Negative Gastrointestinal Negative Genitourinary Negative Hemotologic/Lymphatic Negative Integumentary Negative Musculoskeletal Negative Neurological Negative Psychiatry Negative Respiratory  Social   Never smoked   Medication Metformin, Losartan Potassium, Celecoxib, Simvastatin,   Sx/Procedures  None  Drug Allergies   NKDA  History & Physical: Heent:  Cataract, Right eye NECK: supple without bruits LUNGS: lungs clear to auscultation CV: regular rate and rhythm Abdomen: soft and non-tender  Impression & Plan: Assessment: 1.  COMBINED FORMS AGE RELATED CATARACT; Both Eyes (H25.813) 2.  ASTIGMATISM, REGULAR; Both Eyes (H52.223)  Plan: 1.  Cataract accounts for the patient's decreased vision. This visual impairment is not correctable with a tolerable change in glasses or contact lenses. Cataract surgery with an implantation of a new lens should significantly improve the visual and functional status of the patient. Discussed all risks, benefits, alternatives, and potential complications.  Discussed the procedures and recovery. Patient desires to have surgery. A-scan ordered and performed today for intra-ocular lens calculations. The surgery will be performed in order to improve vision for driving, reading, and for eye examinations. Recommend phacoemulsification with intra-ocular lens. Right Eye. Dilates well - shugarcaine by protocol. Toric Lens. 2.  Toric lens as above.

## 2019-09-11 DIAGNOSIS — H25811 Combined forms of age-related cataract, right eye: Secondary | ICD-10-CM | POA: Diagnosis not present

## 2019-09-14 NOTE — Patient Instructions (Signed)
April Murillo  09/14/2019     @PREFPERIOPPHARMACY @   Your procedure is scheduled on  09/18/2019 .  Report to [facility] at  [phone]  A.M.  Call this number if you have problems the morning of surgery:  [phone]   Remember:  Do not eat or drink after midnight.                       Take these medicines the morning of surgery with A SIP OF WATER  celebrex.    Do not wear jewelry, make-up or nail polish.  Do not wear lotions, powders, or perfumes. Please wear deodorant and brush your teeth.  Do not shave 48 hours prior to surgery.  Men may shave face and neck.  Do not bring valuables to the hospital.  [facility] is not responsible for any belongings or valuables.  Contacts, dentures or bridgework may not be worn into surgery.  Leave your suitcase in the car.  After surgery it may be brought to your room.  For patients admitted to the hospital, discharge time will be determined by your treatment team.  Patients discharged the day of surgery will not be allowed to drive home.   Name and phone number of your driver:   family Special instructions:  DO NOT smoke the morning of your procedure.  Please read over the following fact sheets that you were given. Anesthesia Post-op Instructions and Care and Recovery After Surgery       Cataract Surgery, Care After This sheet gives you information about how to care for yourself after your procedure. Your health care provider may also give you more specific instructions. If you have problems or questions, contact your health care provider. What can I expect after the procedure? After the procedure, it is common to have:  Itching.  Discomfort.  Fluid discharge.  Sensitivity to light and to touch.  Bruising in or around the eye.  Mild blurred vision. Follow these instructions at home: Eye care   Do not touch or rub your eyes.  Protect your eyes as told by your health care provider. You may be told to wear a  protective eye shield or sunglasses.  Do not put a contact lens into the affected eye or eyes until your health care provider approves.  Keep the area around your eye clean and dry: ? Avoid swimming. ? Do not allow water to hit you directly in the face while showering. ? Keep soap and shampoo out of your eyes.  Check your eye every day for signs of infection. Watch for: ? Redness, swelling, or pain. ? Fluid, blood, or pus. ? Warmth. ? A bad smell. ? Vision that is getting worse. ? Sensitivity that is getting worse. Activity  Do not drive for 24 hours if you were given a sedative during your procedure.  Avoid strenuous activities, such as playing contact sports, for as long as told by your health care provider.  Do not drive or use heavy machinery until your health care provider approves.  Do not bend or lift heavy objects. Bending increases pressure in the eye. You can walk, climb stairs, and do light household chores.  Ask your health care provider when you can return to work. If you work in a dusty environment, you may be advised to wear protective eyewear for a period of time. General instructions  Take or apply over-the-counter and prescription medicines only as told  by your health care provider. This includes eye drops.  Keep all follow-up visits as told by your health care provider. This is important. Contact a health care provider if:  You have increased bruising around your eye.  You have pain that is not helped with medicine.  You have a fever.  You have redness, swelling, or pain in your eye.  You have fluid, blood, or pus coming from your incision.  Your vision gets worse.  Your sensitivity to light gets worse. Get help right away if:  You have sudden loss of vision.  You see flashes of light or spots (floaters).  You have severe eye pain.  You develop nausea or vomiting. Summary  After your procedure, it is common to have itching, discomfort,  bruising, fluid discharge, or sensitivity to light.  Follow instructions from your health care provider about caring for your eye after the procedure.  Do not rub your eye after the procedure. You may need to wear eye protection or sunglasses. Do not wear contact lenses. Keep the area around your eye clean and dry.  Avoid activities that require a lot of effort. These include playing sports and lifting heavy objects.  Contact a health care provider if you have increased bruising, pain that does not go away, or a fever. Get help right away if you suddenly lose your vision, see flashes of light or spots, or have severe pain in the eye. This information is not intended to replace advice given to you by your health care provider. Make sure you discuss any questions you have with your health care provider. Document Revised: 03/07/2019 Document Reviewed: 11/08/2017 Elsevier Patient Education  2020 [place] After These instructions provide you with information about caring for yourself after your procedure. Your health care provider may also give you more specific instructions. Your treatment has been planned according to current medical practices, but problems sometimes occur. Call your health care provider if you have any problems or questions after your procedure. What can I expect after the procedure? After your procedure, you may:  Feel sleepy for several hours.  Feel clumsy and have poor balance for several hours.  Feel forgetful about what happened after the procedure.  Have poor judgment for several hours.  Feel nauseous or vomit.  Have a sore throat if you had a breathing tube during the procedure. Follow these instructions at home: For at least 24 hours after the procedure:      Have a responsible adult stay with you. It is important to have someone help care for you until you are awake and alert.  Rest as needed.  Do not: ? Participate  in activities in which you could fall or become injured. ? Drive. ? Use heavy machinery. ? Drink alcohol. ? Take sleeping pills or medicines that cause drowsiness. ? Make important decisions or sign legal documents. ? Take care of children on your own. Eating and drinking  Follow the diet that is recommended by your health care provider.  If you vomit, drink water, juice, or soup when you can drink without vomiting.  Make sure you have little or no nausea before eating solid foods. General instructions  Take over-the-counter and prescription medicines only as told by your health care provider.  If you have sleep apnea, surgery and certain medicines can increase your risk for breathing problems. Follow instructions from your health care provider about wearing your sleep device: ? Anytime you are sleeping, including during  daytime naps. ? While taking prescription pain medicines, sleeping medicines, or medicines that make you drowsy.  If you smoke, do not smoke without supervision.  Keep all follow-up visits as told by your health care provider. This is important. Contact a health care provider if:  You keep feeling nauseous or you keep vomiting.  You feel light-headed.  You develop a rash.  You have a fever. Get help right away if:  You have trouble breathing. Summary  For several hours after your procedure, you may feel sleepy and have poor judgment.  Have a responsible adult stay with you for at least 24 hours or until you are awake and alert. This information is not intended to replace advice given to you by your health care provider. Make sure you discuss any questions you have with your health care provider. Document Revised: 08/09/2017 Document Reviewed: 09/01/2015 Elsevier Patient Education  [place].

## 2019-09-15 ENCOUNTER — Other Ambulatory Visit: Payer: Self-pay

## 2019-09-15 ENCOUNTER — Other Ambulatory Visit (HOSPITAL_COMMUNITY)
Admission: RE | Admit: 2019-09-15 | Discharge: 2019-09-15 | Disposition: A | Discharge status: Home / Self Care | Payer: Medicare HMO | Source: Ambulatory Visit | Attending: Ophthalmology | Admitting: Ophthalmology

## 2019-09-15 ENCOUNTER — Encounter (HOSPITAL_COMMUNITY): Payer: Self-pay

## 2019-09-15 ENCOUNTER — Encounter (HOSPITAL_COMMUNITY)
Admission: RE | Admit: 2019-09-15 | Discharge: 2019-09-15 | Disposition: A | Discharge status: Home / Self Care | Payer: Medicare HMO | Source: Ambulatory Visit | Attending: Ophthalmology | Admitting: Ophthalmology

## 2019-09-15 DIAGNOSIS — Z01812 Encounter for preprocedural laboratory examination: Secondary | ICD-10-CM | POA: Diagnosis present

## 2019-09-15 DIAGNOSIS — Z20822 Contact with and (suspected) exposure to covid-19: Secondary | ICD-10-CM | POA: Insufficient documentation

## 2019-09-15 HISTORY — DX: Type 2 diabetes mellitus without complications: E11.9

## 2019-09-15 HISTORY — DX: Essential (primary) hypertension: I10

## 2019-09-15 HISTORY — DX: Unspecified osteoarthritis, unspecified site: M19.90

## 2019-09-15 LAB — BASIC METABOLIC PANEL
Anion gap: 6 (ref 5–15)
BUN: 12 mg/dL (ref 8–23)
CO2: 24 mmol/L (ref 22–32)
Calcium: 8.5 mg/dL — ABNORMAL LOW (ref 8.9–10.3)
Chloride: 102 mmol/L (ref 98–111)
Creatinine, Ser: 0.61 mg/dL (ref 0.44–1.00)
GFR calc Af Amer: >60 mL/min (ref >60)
GFR calc non Af Amer: >60 mL/min (ref >60)
Glucose, Bld: 112 mg/dL — ABNORMAL HIGH (ref 70–99)
Potassium: 4.3 mmol/L (ref 3.5–5.1)
Sodium: 132 mmol/L — ABNORMAL LOW (ref 135–145)

## 2019-09-15 LAB — HEMOGLOBIN A1C
Hgb A1c MFr Bld: 6.4 % — ABNORMAL HIGH (ref 4.8–5.6)
Mean Plasma Glucose: 136.98 mg/dL

## 2019-09-16 LAB — SARS CORONAVIRUS 2 (TAT 6-24 HRS): SARS Coronavirus 2: NEGATIVE

## 2019-09-18 ENCOUNTER — Ambulatory Visit (HOSPITAL_COMMUNITY): Payer: Medicare HMO | Admitting: Anesthesiology

## 2019-09-18 ENCOUNTER — Ambulatory Visit (HOSPITAL_COMMUNITY)
Admission: RE | Admit: 2019-09-18 | Discharge: 2019-09-18 | Disposition: A | Discharge status: Home / Self Care | Payer: Medicare HMO | Attending: Ophthalmology | Admitting: Ophthalmology

## 2019-09-18 ENCOUNTER — Other Ambulatory Visit: Payer: Self-pay

## 2019-09-18 ENCOUNTER — Encounter (HOSPITAL_COMMUNITY)
Admission: RE | Disposition: A | Discharge status: Home / Self Care | Payer: Self-pay | Source: Home / Self Care | Attending: Ophthalmology

## 2019-09-18 ENCOUNTER — Encounter (HOSPITAL_COMMUNITY): Payer: Self-pay | Admitting: Ophthalmology

## 2019-09-18 DIAGNOSIS — Z7984 Long term (current) use of oral hypoglycemic drugs: Secondary | ICD-10-CM | POA: Diagnosis not present

## 2019-09-18 DIAGNOSIS — H25813 Combined forms of age-related cataract, bilateral: Secondary | ICD-10-CM | POA: Diagnosis not present

## 2019-09-18 DIAGNOSIS — E1136 Type 2 diabetes mellitus with diabetic cataract: Secondary | ICD-10-CM | POA: Insufficient documentation

## 2019-09-18 DIAGNOSIS — I10 Essential (primary) hypertension: Secondary | ICD-10-CM | POA: Insufficient documentation

## 2019-09-18 DIAGNOSIS — H52223 Regular astigmatism, bilateral: Secondary | ICD-10-CM | POA: Insufficient documentation

## 2019-09-18 DIAGNOSIS — Z79899 Other long term (current) drug therapy: Secondary | ICD-10-CM | POA: Insufficient documentation

## 2019-09-18 DIAGNOSIS — M1990 Unspecified osteoarthritis, unspecified site: Secondary | ICD-10-CM | POA: Diagnosis not present

## 2019-09-18 DIAGNOSIS — H25811 Combined forms of age-related cataract, right eye: Secondary | ICD-10-CM | POA: Diagnosis not present

## 2019-09-18 HISTORY — DX: Pure hypercholesterolemia, unspecified: E78.00

## 2019-09-18 HISTORY — PX: CATARACT EXTRACTION W/PHACO: SHX586

## 2019-09-18 LAB — GLUCOSE, CAPILLARY: Glucose-Capillary: 117 mg/dL — ABNORMAL HIGH (ref 70–99)

## 2019-09-18 SURGERY — PHACOEMULSIFICATION, CATARACT, WITH IOL INSERTION
Anesthesia: Monitor Anesthesia Care | Site: Eye | Laterality: Right

## 2019-09-18 MED ORDER — LIDOCAINE HCL (PF) 1 % IJ SOLN
INTRAOCULAR | Status: DC | PRN
  Administered 2019-09-18: 1 mL via OPHTHALMIC

## 2019-09-18 MED ORDER — CYCLOPENTOLATE-PHENYLEPHRINE 0.2-1 % OP SOLN
1.0000 [drp] | OPHTHALMIC | Status: AC | PRN
  Administered 2019-09-18 (×3): 1 [drp] via OPHTHALMIC

## 2019-09-18 MED ORDER — NEOMYCIN-POLYMYXIN-DEXAMETH 3.5-10000-0.1 OP SUSP
OPHTHALMIC | Status: DC | PRN
  Administered 2019-09-18: 1 [drp] via OPHTHALMIC

## 2019-09-18 MED ORDER — EPINEPHRINE PF 1 MG/ML IJ SOLN
INTRAOCULAR | Status: DC | PRN
  Administered 2019-09-18: 500 mL

## 2019-09-18 MED ORDER — LIDOCAINE HCL 3.5 % OP GEL
1.0000 "application " | Freq: Once | OPHTHALMIC | Status: AC
  Administered 2019-09-18: 1 via OPHTHALMIC

## 2019-09-18 MED ORDER — TETRACAINE HCL 0.5 % OP SOLN
1.0000 [drp] | OPHTHALMIC | Status: AC | PRN
  Administered 2019-09-18 (×3): 1 [drp] via OPHTHALMIC

## 2019-09-18 MED ORDER — PROVISC 10 MG/ML IO SOLN
INTRAOCULAR | Status: DC | PRN
  Administered 2019-09-18: 0.85 mL via INTRAOCULAR

## 2019-09-18 MED ORDER — PHENYLEPHRINE HCL 2.5 % OP SOLN
1.0000 [drp] | OPHTHALMIC | Status: AC | PRN
  Administered 2019-09-18 (×3): 1 [drp] via OPHTHALMIC

## 2019-09-18 MED ORDER — BSS IO SOLN
INTRAOCULAR | Status: DC | PRN
  Administered 2019-09-18: 15 mL via INTRAOCULAR

## 2019-09-18 MED ORDER — SODIUM HYALURONATE 23 MG/ML IO SOLN
INTRAOCULAR | Status: DC | PRN
  Administered 2019-09-18: 0.6 mL via INTRAOCULAR

## 2019-09-18 MED ORDER — POVIDONE-IODINE 5 % OP SOLN
OPHTHALMIC | Status: DC | PRN
  Administered 2019-09-18: 1 via OPHTHALMIC

## 2019-09-18 SURGICAL SUPPLY — 13 items
CLOTH BEACON ORANGE TIMEOUT ST (SAFETY) ×1 IMPLANT
EYE SHIELD UNIVERSAL CLEAR (GAUZE/BANDAGES/DRESSINGS) ×1 IMPLANT
GLOVE BIOGEL PI IND STRL 7.0 (GLOVE) IMPLANT
GLOVE BIOGEL PI INDICATOR 7.0 (GLOVE) ×2
LENS ALC ACRYL/TECN (Ophthalmic Related) ×1 IMPLANT
NDL HYPO 18GX1.5 BLUNT FILL (NEEDLE) IMPLANT
NEEDLE HYPO 18GX1.5 BLUNT FILL (NEEDLE) ×2 IMPLANT
PAD ARMBOARD 7.5X6 YLW CONV (MISCELLANEOUS) ×1 IMPLANT
SYR TB 1ML LL NO SAFETY (SYRINGE) ×1 IMPLANT
TAPE SURG TRANSPORE 1 IN (GAUZE/BANDAGES/DRESSINGS) IMPLANT
TAPE SURGICAL TRANSPORE 1 IN (GAUZE/BANDAGES/DRESSINGS) ×2
VISCOELASTIC ADDITIONAL (OPHTHALMIC RELATED) ×1 IMPLANT
WATER STERILE IRR 250ML POUR (IV SOLUTION) ×1 IMPLANT

## 2019-09-18 NOTE — Anesthesia Postprocedure Evaluation (Signed)
Anesthesia Post Note  Patient: April Murillo  Procedure(s) Performed: CATARACT EXTRACTION PHACO AND INTRAOCULAR LENS PLACEMENT RIGHT EYE (Right Eye)  Patient location during evaluation: PACU Anesthesia Type: MAC Level of consciousness: awake and alert and oriented Pain management: pain level controlled Vital Signs Assessment: post-procedure vital signs reviewed and stable Respiratory status: spontaneous breathing Cardiovascular status: blood pressure returned to baseline and stable Postop Assessment: no apparent nausea or vomiting Anesthetic complications: no     Last Vitals:  Vitals:   09/18/19 0702  BP: (!) 152/84  Pulse: 70  Resp: 18  Temp: 36.6 C  SpO2: 95%    Last Pain:  Vitals:   09/18/19 0702  TempSrc: Oral  PainSc: 0-No pain                 Daisja Kessinger

## 2019-09-18 NOTE — Interval H&P Note (Signed)
History and Physical Interval Note: The H and P was reviewed and updated. The patient was examined.  No changes were found after exam.  The surgical eye was marked.  09/18/2019 8:22 AM  April Murillo  has presented today for surgery, with the diagnosis of Nuclear sclerotic cataract - Right eye.  The various methods of treatment have been discussed with the patient and family. After consideration of risks, benefits and other options for treatment, the patient has consented to  Procedure(s) with comments: CATARACT EXTRACTION PHACO AND INTRAOCULAR LENS PLACEMENT RIGHT EYE (Right) - right as a surgical intervention.  The patient's history has been reviewed, patient examined, no change in status, stable for surgery.  I have reviewed the patient's chart and labs.  Questions were answered to the patient's satisfaction.     Baruch Goldmann

## 2019-09-18 NOTE — Anesthesia Preprocedure Evaluation (Signed)
Anesthesia Evaluation  Patient identified by MRN, date of birth, ID band Patient awake    Reviewed: Allergy & Precautions, NPO status , Patient's Chart, lab work & pertinent test results  Airway Mallampati: II  TM Distance: >3 FB Neck ROM: Full    Dental  (+) Dental Advisory Given, Partial Upper, Partial Lower   Pulmonary neg pulmonary ROS,    breath sounds clear to auscultation       Cardiovascular Exercise Tolerance: Good hypertension, Pt. on medications  Rhythm:Regular Rate:Normal     Neuro/Psych negative neurological ROS  negative psych ROS   GI/Hepatic negative GI ROS, Neg liver ROS,   Endo/Other  diabetes, Well Controlled, Type 2, Oral Hypoglycemic Agents  Renal/GU      Musculoskeletal  (+) Arthritis , Osteoarthritis,    Abdominal   Peds  Hematology   Anesthesia Other Findings   Reproductive/Obstetrics                           Anesthesia Physical Anesthesia Plan  ASA: II  Anesthesia Plan: MAC   Post-op Pain Management:    Induction:   PONV Risk Score and Plan: 1 and Treatment may vary due to age or medical condition  Airway Management Planned: Nasal Cannula and Natural Airway  Additional Equipment:   Intra-op Plan:   Post-operative Plan:   Informed Consent: I have reviewed the patients History and Physical, chart, labs and discussed the procedure including the risks, benefits and alternatives for the proposed anesthesia with the patient or authorized representative who has indicated his/her understanding and acceptance.     Dental advisory given  Plan Discussed with: CRNA and Surgeon  Anesthesia Plan Comments:        Anesthesia Quick Evaluation

## 2019-09-18 NOTE — Discharge Instructions (Addendum)
Please discharge patient when stable, will follow up today with Dr. Wrzosek at the [facility] [place] office immediately following discharge.  Leave shield in place until visit.  All paperwork with discharge instructions will be given at the office.  [facility] [place] Address:  [address]  [place], [place] [address]  

## 2019-09-18 NOTE — Transfer of Care (Signed)
Immediate Anesthesia Transfer of Care Note  Patient: April Murillo  Procedure(s) Performed: CATARACT EXTRACTION PHACO AND INTRAOCULAR LENS PLACEMENT RIGHT EYE (Right Eye)  Patient Location: PACU  Anesthesia Type:MAC  Level of Consciousness: awake  Airway & Oxygen Therapy: Patient Spontanous Breathing  Post-op Assessment: Report given to RN  Post vital signs: Reviewed  Last Vitals:  Vitals Value Taken Time  BP    Temp    Pulse    Resp    SpO2      Last Pain:  Vitals:   09/18/19 0702  TempSrc: Oral  PainSc: 0-No pain         Complications: No apparent anesthesia complications

## 2019-09-18 NOTE — Op Note (Signed)
Date of procedure: 09/18/19  Pre-operative diagnosis:  Visually significant combined form age-related cataract, Right Eye (H25.811)  Post-operative diagnosis:  Visually significant combined form age-related cataract, Right Eye (H25.811)  Procedure: Removal of cataract via phacoemulsification and insertion of intra-ocular lens Wynetta Emery and [employer] [id number]  +13.0D into the capsular bag of the Right Eye  Attending surgeon: Gerda Diss. Morelia Cassells, MD, MA  Anesthesia: MAC, Topical Akten  Complications: None  Estimated Blood Loss: <22m (minimal)  Specimens: None  Implants: As above  Indications:  Visually significant age-related cataract, Right Eye  Procedure:  The patient was seen and identified in the pre-operative area. The operative eye was identified and dilated.  The operative eye was marked.  Topical anesthesia was administered to the operative eye.     The patient was then to the operative suite and placed in the supine position.  A timeout was performed confirming the patient, procedure to be performed, and all other relevant information.   The patient's face was prepped and draped in the usual fashion for intra-ocular surgery.  A lid speculum was placed into the operative eye and the surgical microscope moved into place and focused.  A superotemporal paracentesis was created using a 20 gauge paracentesis blade.  Shugarcaine was injected into the anterior chamber.  Viscoelastic was injected into the anterior chamber.  A temporal clear-corneal main wound incision was created using a 2.424mmicrokeratome.  A continuous curvilinear capsulorrhexis was initiated using an irrigating cystitome and completed using capsulorrhexis forceps.  Hydrodissection and hydrodeliniation were performed.  Viscoelastic was injected into the anterior chamber.  A phacoemulsification handpiece and a chopper as a second instrument were used to remove the nucleus and epinucleus. The irrigation/aspiration handpiece was  used to remove any remaining cortical material.   The capsular bag was reinflated with viscoelastic, checked, and found to be intact.  The intraocular lens was inserted into the capsular bag.  The irrigation/aspiration handpiece was used to remove any remaining viscoelastic.  The clear corneal wound and paracentesis wounds were then hydrated and checked with Weck-Cels to be watertight.  The lid-speculum was removed.  The drape was removed.  The patient's face was cleaned with a wet and dry 4x4.   Maxitrol was instilled in the eye before a clear shield was taped over the eye. The patient was taken to the post-operative care unit in good condition, having tolerated the procedure well.  Post-Op Instructions: [facility] and will receive all other orders and instructions.

## 2019-09-25 DIAGNOSIS — H25812 Combined forms of age-related cataract, left eye: Secondary | ICD-10-CM | POA: Diagnosis not present

## 2019-09-26 NOTE — H&P (Signed)
Surgical History & Physical  Patient Name: April Murillo DOB: 19-Jan-1951  Surgery: Cataract extraction with intraocular lens implant phacoemulsification; Left Eye  Surgeon: Baruch Goldmann MD Surgery Date:  10/02/2019 Pre-Op Date:  09/25/2019  HPI: A 31 Yr. old female patient 1. 1. The patient is returning after cataract surgery. The right eye is affected. Status post cataract surgery on 09-18-2019: Since the last visit, the affected area feels improvement and is doing well. The patient's vision is improved. Patient is following medication instructions with 3 in 1 drop. Patient eager to proceed with OS due to OU not working together. OD very good since sx but OS cloudy and not tolerating glasses. This is negatively affecting the patient's quality of life. HPI Completed by Dr. Baruch Goldmann  Medical History: Cataracts Macula Degeneration Diabetes High Blood Pressure LDL  Review of Systems Negative Allergic/Immunologic Negative Cardiovascular Negative Constitutional Negative Ear, Nose, Mouth & Throat Negative Endocrine Negative Eyes Negative Gastrointestinal Negative Genitourinary Negative Hemotologic/Lymphatic Negative Integumentary Negative Musculoskeletal Negative Neurological Negative Psychiatry Negative Respiratory  Social   Never smoked  Medication Prednisolone-gatiflox-bromfenac,  Metformin, Losartan Potassium, Celecoxib, Simvastatin,   Sx/Procedures Phaco c IOL OD,   Drug Allergies   NKDA  History & Physical: Heent:  Cataract, Left eye NECK: supple without bruits LUNGS: lungs clear to auscultation CV: regular rate and rhythm Abdomen: soft and non-tender  Impression & Plan: Assessment: 1.  CATARACT EXTRACTION STATUS; Right Eye (Z98.41) 2.  COMBINED FORMS AGE RELATED CATARACT; Left Eye (H25.812) 3.  NUCLEAR SCLEROSIS AGE RELATED; Left Eye (H25.12)  Plan: 1.  1 week after cataract surgery. Doing well with improved vision and normal eye pressure. Call  with any problems or concerns. Continue Gati-Brom-Pred 2x/day for 3 more weeks. 2.  Cataract accounts for the patient's decreased vision. This visual impairment is not correctable with a tolerable change in glasses or contact lenses. Cataract surgery with an implantation of a new lens should significantly improve the visual and functional status of the patient. Discussed all risks, benefits, alternatives, and potential complications. Discussed the procedures and recovery. Patient desires to have surgery. A-scan ordered and performed today for intra-ocular lens calculations. The surgery will be performed in order to improve vision for driving, reading, and for eye examinations. Recommend phacoemulsification with intra-ocular lens. Left Eye. Surgery required to correct imbalance of vision. Dilates well - shugarcaine by protocol.

## 2019-09-27 ENCOUNTER — Encounter (HOSPITAL_COMMUNITY): Payer: Self-pay

## 2019-09-28 ENCOUNTER — Encounter (HOSPITAL_COMMUNITY)
Admission: RE | Admit: 2019-09-28 | Discharge: 2019-09-28 | Disposition: A | Discharge status: Home / Self Care | Payer: Medicare HMO | Source: Ambulatory Visit | Attending: Ophthalmology | Admitting: Ophthalmology

## 2019-09-28 ENCOUNTER — Other Ambulatory Visit: Payer: Self-pay

## 2019-09-29 ENCOUNTER — Other Ambulatory Visit: Payer: Self-pay

## 2019-09-29 ENCOUNTER — Other Ambulatory Visit (HOSPITAL_COMMUNITY)
Admission: RE | Admit: 2019-09-29 | Discharge: 2019-09-29 | Disposition: A | Discharge status: Home / Self Care | Payer: Medicare HMO | Source: Ambulatory Visit | Attending: Ophthalmology | Admitting: Ophthalmology

## 2019-09-29 DIAGNOSIS — Z01812 Encounter for preprocedural laboratory examination: Secondary | ICD-10-CM | POA: Insufficient documentation

## 2019-09-29 DIAGNOSIS — Z20822 Contact with and (suspected) exposure to covid-19: Secondary | ICD-10-CM | POA: Diagnosis not present

## 2019-09-30 LAB — SARS CORONAVIRUS 2 (TAT 6-24 HRS): SARS Coronavirus 2: NEGATIVE

## 2019-10-02 ENCOUNTER — Ambulatory Visit (HOSPITAL_COMMUNITY): Payer: Medicare HMO | Admitting: Anesthesiology

## 2019-10-02 ENCOUNTER — Encounter (HOSPITAL_COMMUNITY)
Admission: RE | Disposition: A | Discharge status: Home / Self Care | Payer: Self-pay | Source: Home / Self Care | Attending: Ophthalmology

## 2019-10-02 ENCOUNTER — Ambulatory Visit (HOSPITAL_COMMUNITY)
Admission: RE | Admit: 2019-10-02 | Discharge: 2019-10-02 | Disposition: A | Discharge status: Home / Self Care | Payer: Medicare HMO | Attending: Ophthalmology | Admitting: Ophthalmology

## 2019-10-02 ENCOUNTER — Encounter (HOSPITAL_COMMUNITY): Payer: Self-pay | Admitting: Ophthalmology

## 2019-10-02 ENCOUNTER — Other Ambulatory Visit: Payer: Self-pay

## 2019-10-02 DIAGNOSIS — Z9841 Cataract extraction status, right eye: Secondary | ICD-10-CM | POA: Diagnosis not present

## 2019-10-02 DIAGNOSIS — H3530 Unspecified macular degeneration: Secondary | ICD-10-CM | POA: Insufficient documentation

## 2019-10-02 DIAGNOSIS — Z79899 Other long term (current) drug therapy: Secondary | ICD-10-CM | POA: Diagnosis not present

## 2019-10-02 DIAGNOSIS — Z7984 Long term (current) use of oral hypoglycemic drugs: Secondary | ICD-10-CM | POA: Insufficient documentation

## 2019-10-02 DIAGNOSIS — Z961 Presence of intraocular lens: Secondary | ICD-10-CM | POA: Insufficient documentation

## 2019-10-02 DIAGNOSIS — I10 Essential (primary) hypertension: Secondary | ICD-10-CM | POA: Insufficient documentation

## 2019-10-02 DIAGNOSIS — Z791 Long term (current) use of non-steroidal anti-inflammatories (NSAID): Secondary | ICD-10-CM | POA: Diagnosis not present

## 2019-10-02 DIAGNOSIS — E7800 Pure hypercholesterolemia, unspecified: Secondary | ICD-10-CM | POA: Diagnosis not present

## 2019-10-02 DIAGNOSIS — E1136 Type 2 diabetes mellitus with diabetic cataract: Secondary | ICD-10-CM | POA: Insufficient documentation

## 2019-10-02 DIAGNOSIS — H25812 Combined forms of age-related cataract, left eye: Secondary | ICD-10-CM | POA: Diagnosis not present

## 2019-10-02 HISTORY — PX: CATARACT EXTRACTION W/PHACO: SHX586

## 2019-10-02 LAB — GLUCOSE, CAPILLARY: Glucose-Capillary: 115 mg/dL — ABNORMAL HIGH (ref 70–99)

## 2019-10-02 SURGERY — PHACOEMULSIFICATION, CATARACT, WITH IOL INSERTION
Anesthesia: Monitor Anesthesia Care | Site: Eye | Laterality: Left

## 2019-10-02 MED ORDER — CYCLOPENTOLATE-PHENYLEPHRINE 0.2-1 % OP SOLN
1.0000 [drp] | OPHTHALMIC | Status: AC | PRN
  Administered 2019-10-02 (×3): 1 [drp] via OPHTHALMIC

## 2019-10-02 MED ORDER — PROVISC 10 MG/ML IO SOLN
INTRAOCULAR | Status: DC | PRN
  Administered 2019-10-02: 0.85 mL via INTRAOCULAR

## 2019-10-02 MED ORDER — TETRACAINE HCL 0.5 % OP SOLN
1.0000 [drp] | OPHTHALMIC | Status: AC | PRN
  Administered 2019-10-02 (×3): 1 [drp] via OPHTHALMIC

## 2019-10-02 MED ORDER — NEOMYCIN-POLYMYXIN-DEXAMETH 3.5-10000-0.1 OP SUSP
OPHTHALMIC | Status: DC | PRN
  Administered 2019-10-02: 1 [drp] via OPHTHALMIC

## 2019-10-02 MED ORDER — POVIDONE-IODINE 5 % OP SOLN
OPHTHALMIC | Status: DC | PRN
  Administered 2019-10-02: 1 via OPHTHALMIC

## 2019-10-02 MED ORDER — EPINEPHRINE PF 1 MG/ML IJ SOLN
INTRAOCULAR | Status: DC | PRN
  Administered 2019-10-02: 500 mL

## 2019-10-02 MED ORDER — BSS IO SOLN
INTRAOCULAR | Status: DC | PRN
  Administered 2019-10-02: 15 mL via INTRAOCULAR

## 2019-10-02 MED ORDER — SODIUM HYALURONATE 23 MG/ML IO SOLN
INTRAOCULAR | Status: DC | PRN
  Administered 2019-10-02: 0.6 mL via INTRAOCULAR

## 2019-10-02 MED ORDER — LIDOCAINE HCL (PF) 1 % IJ SOLN
INTRAOCULAR | Status: DC | PRN
  Administered 2019-10-02: 1 mL via OPHTHALMIC

## 2019-10-02 MED ORDER — PHENYLEPHRINE HCL 2.5 % OP SOLN
1.0000 [drp] | OPHTHALMIC | Status: AC | PRN
  Administered 2019-10-02 (×3): 1 [drp] via OPHTHALMIC

## 2019-10-02 MED ORDER — LIDOCAINE HCL 3.5 % OP GEL
1.0000 "application " | Freq: Once | OPHTHALMIC | Status: AC
  Administered 2019-10-02: 1 via OPHTHALMIC

## 2019-10-02 SURGICAL SUPPLY — 19 items
CLOTH BEACON ORANGE TIMEOUT ST (SAFETY) ×1 IMPLANT
DEVICE MILOOP (MISCELLANEOUS) IMPLANT
EYE SHIELD UNIVERSAL CLEAR (GAUZE/BANDAGES/DRESSINGS) ×1 IMPLANT
GLOVE BIOGEL PI IND STRL 6.5 (GLOVE) IMPLANT
GLOVE BIOGEL PI IND STRL 7.0 (GLOVE) IMPLANT
GLOVE BIOGEL PI INDICATOR 6.5 (GLOVE) ×1
GLOVE BIOGEL PI INDICATOR 7.0 (GLOVE) ×1
LENS ALC ACRYL/TECN (Ophthalmic Related) ×1 IMPLANT
MILOOP DEVICE (MISCELLANEOUS)
NDL HYPO 18GX1.5 BLUNT FILL (NEEDLE) IMPLANT
NEEDLE HYPO 18GX1.5 BLUNT FILL (NEEDLE) ×2 IMPLANT
PAD ARMBOARD 7.5X6 YLW CONV (MISCELLANEOUS) ×1 IMPLANT
RING MALYGIN (MISCELLANEOUS) IMPLANT
RING MALYGIN 7.0 (MISCELLANEOUS) IMPLANT
SYR TB 1ML LL NO SAFETY (SYRINGE) ×1 IMPLANT
TAPE SURG TRANSPORE 1 IN (GAUZE/BANDAGES/DRESSINGS) IMPLANT
TAPE SURGICAL TRANSPORE 1 IN (GAUZE/BANDAGES/DRESSINGS) ×2
VISCOELASTIC ADDITIONAL (OPHTHALMIC RELATED) ×1 IMPLANT
WATER STERILE IRR 250ML POUR (IV SOLUTION) ×1 IMPLANT

## 2019-10-02 NOTE — Interval H&P Note (Signed)
History and Physical Interval Note: The H and P was reviewed and updated. The patient was examined.  No changes were found after exam.  The surgical eye was marked.  10/02/2019 9:50 AM  April Murillo  has presented today for surgery, with the diagnosis of Nuclear sclerotic cataract - Left eye.  The various methods of treatment have been discussed with the patient and family. After consideration of risks, benefits and other options for treatment, the patient has consented to  Procedure(s) with comments: CATARACT EXTRACTION PHACO AND INTRAOCULAR LENS PLACEMENT ([place]) (Left) - left as a surgical intervention.  The patient's history has been reviewed, patient examined, no change in status, stable for surgery.  I have reviewed the patient's chart and labs.  Questions were answered to the patient's satisfaction.     Baruch Goldmann

## 2019-10-02 NOTE — Transfer of Care (Signed)
Immediate Anesthesia Transfer of Care Note  Patient: April Murillo  Procedure(s) Performed: CATARACT EXTRACTION PHACO AND INTRAOCULAR LENS PLACEMENT ([facility]) CDE: (Left Eye)  Patient Location: Short Stay  Anesthesia Type:MAC  Level of Consciousness: awake  Airway & Oxygen Therapy: Patient Spontanous Breathing  Post-op Assessment: Report given to RN  Post vital signs: Reviewed  Last Vitals:  Vitals Value Taken Time  BP    Temp    Pulse    Resp    SpO2      Last Pain:  Vitals:   10/02/19 0904  TempSrc: Oral  PainSc: 0-No pain      Patients Stated Pain Goal: 9 (11/88/67 [id number])  Complications: No apparent anesthesia complications

## 2019-10-02 NOTE — Anesthesia Postprocedure Evaluation (Signed)
Anesthesia Post Note  Patient: April Murillo  Procedure(s) Performed: CATARACT EXTRACTION PHACO AND INTRAOCULAR LENS PLACEMENT ([facility]) CDE: (Left Eye)  Patient location during evaluation: Short Stay Anesthesia Type: MAC Level of consciousness: awake and alert and oriented Pain management: pain level controlled Vital Signs Assessment: post-procedure vital signs reviewed and stable Respiratory status: spontaneous breathing Cardiovascular status: blood pressure returned to baseline and stable Postop Assessment: no apparent nausea or vomiting Anesthetic complications: no     Last Vitals:  Vitals:   10/02/19 0904  BP: (!) 145/79  Pulse: 72  Resp: 16  Temp: 37 C  SpO2: 97%    Last Pain:  Vitals:   10/02/19 0904  TempSrc: Oral  PainSc: 0-No pain                 Jeramy Dimmick

## 2019-10-02 NOTE — Anesthesia Preprocedure Evaluation (Signed)
Anesthesia Evaluation  Patient identified by MRN, date of birth, ID band Patient awake    Reviewed: Allergy & Precautions, NPO status , Patient's Chart, lab work & pertinent test results  Airway Mallampati: II  TM Distance: >3 FB Neck ROM: Full    Dental  (+) Dental Advisory Given, Partial Upper, Partial Lower   Pulmonary neg pulmonary ROS,    breath sounds clear to auscultation       Cardiovascular Exercise Tolerance: Good hypertension, Pt. on medications  Rhythm:Regular Rate:Normal     Neuro/Psych negative neurological ROS  negative psych ROS   GI/Hepatic negative GI ROS, Neg liver ROS,   Endo/Other  diabetes, Well Controlled, Type 2, Oral Hypoglycemic Agents  Renal/GU      Musculoskeletal  (+) Arthritis , Osteoarthritis,    Abdominal   Peds  Hematology   Anesthesia Other Findings   Reproductive/Obstetrics                             Anesthesia Physical  Anesthesia Plan  ASA: II  Anesthesia Plan: MAC   Post-op Pain Management:    Induction:   PONV Risk Score and Plan: 1 and Treatment may vary due to age or medical condition  Airway Management Planned: Nasal Cannula and Natural Airway  Additional Equipment:   Intra-op Plan:   Post-operative Plan:   Informed Consent: I have reviewed the patients History and Physical, chart, labs and discussed the procedure including the risks, benefits and alternatives for the proposed anesthesia with the patient or authorized representative who has indicated his/her understanding and acceptance.     Dental advisory given  Plan Discussed with: CRNA and Surgeon  Anesthesia Plan Comments:         Anesthesia Quick Evaluation

## 2019-10-02 NOTE — Discharge Instructions (Signed)
Please discharge patient when stable, will follow up today with Dr. Jaicob Dia at the [facility] office immediately following discharge.  Leave shield in place until visit.  All paperwork with discharge instructions will be given at the office.  [facility] Address:  [address]  [place], [place] [address]  

## 2019-10-02 NOTE — Op Note (Signed)
Date of procedure: 10/02/19  Pre-operative diagnosis: Visually significant age-related combined cataract, Left Eye (H25.812)  Post-operative diagnosis: Visually significant age-related combined cataract, Left Eye (H25.812)  Procedure: Removal of cataract via phacoemulsification and insertion of intra-ocular lens Wynetta Emery and [place]  +13.0D into the capsular bag of the Left Eye  Attending surgeon: Gerda Diss. Nickolaus Bordelon, MD, MA  Anesthesia: MAC, Topical [place]  Complications: None  Estimated Blood Loss: <71m (minimal)  Specimens: None  Implants: As above  Indications:  Visually significant age-related cataract, Left Eye  Procedure:  The patient was seen and identified in the pre-operative area. The operative eye was identified and dilated.  The operative eye was marked.  Topical anesthesia was administered to the operative eye.     The patient was then to the operative suite and placed in the supine position.  A timeout was performed confirming the patient, procedure to be performed, and all other relevant information.   The patient's face was prepped and draped in the usual fashion for intra-ocular surgery.  A lid speculum was placed into the operative eye and the surgical microscope moved into place and focused.  An inferotemporal paracentesis was created using a 20 gauge paracentesis blade.  Shugarcaine was injected into the anterior chamber.  Viscoelastic was injected into the anterior chamber.  A temporal clear-corneal main wound incision was created using a 2.490mmicrokeratome.  A continuous curvilinear capsulorrhexis was initiated using an irrigating cystitome and completed using capsulorrhexis forceps.  Hydrodissection and hydrodeliniation were performed.  Viscoelastic was injected into the anterior chamber.  A phacoemulsification handpiece and a chopper as a second instrument were used to remove the nucleus and epinucleus. The irrigation/aspiration handpiece was used to remove  any remaining cortical material.   The capsular bag was reinflated with viscoelastic, checked, and found to be intact.  The intraocular lens was inserted into the capsular bag.  The irrigation/aspiration handpiece was used to remove any remaining viscoelastic.  The clear corneal wound and paracentesis wounds were then hydrated and checked with Weck-Cels to be watertight.  The lid-speculum was removed.  The drape was removed.  The patient's face was cleaned with a wet and dry 4x4.   Maxitrol was instilled in the eye before a clear shield was taped over the eye. The patient was taken to the post-operative care unit in good condition, having tolerated the procedure well.  Post-Op Instructions: [facility] and will receive all other orders and instructions.

## 2019-10-31 DIAGNOSIS — R69 Illness, unspecified: Secondary | ICD-10-CM | POA: Diagnosis not present

## 2019-11-01 DIAGNOSIS — H5203 Hypermetropia, bilateral: Secondary | ICD-10-CM | POA: Diagnosis not present

## 2019-11-01 DIAGNOSIS — Z0101 Encounter for examination of eyes and vision with abnormal findings: Secondary | ICD-10-CM | POA: Diagnosis not present

## 2019-11-08 DIAGNOSIS — Z6835 Body mass index (BMI) 35.0-35.9, adult: Secondary | ICD-10-CM | POA: Diagnosis not present

## 2019-11-08 DIAGNOSIS — E119 Type 2 diabetes mellitus without complications: Secondary | ICD-10-CM | POA: Diagnosis not present

## 2019-11-08 DIAGNOSIS — I10 Essential (primary) hypertension: Secondary | ICD-10-CM | POA: Diagnosis not present

## 2019-11-08 DIAGNOSIS — E785 Hyperlipidemia, unspecified: Secondary | ICD-10-CM | POA: Diagnosis not present

## 2019-11-08 DIAGNOSIS — M25561 Pain in right knee: Secondary | ICD-10-CM | POA: Diagnosis not present

## 2019-11-16 DIAGNOSIS — B9681 Helicobacter pylori [H. pylori] as the cause of diseases classified elsewhere: Secondary | ICD-10-CM | POA: Diagnosis not present

## 2019-12-18 DIAGNOSIS — R69 Illness, unspecified: Secondary | ICD-10-CM | POA: Diagnosis not present

## 2020-01-25 DIAGNOSIS — Z0389 Encounter for observation for other suspected diseases and conditions ruled out: Secondary | ICD-10-CM | POA: Diagnosis not present

## 2020-01-25 DIAGNOSIS — M85851 Other specified disorders of bone density and structure, right thigh: Secondary | ICD-10-CM | POA: Diagnosis not present

## 2020-01-25 DIAGNOSIS — M810 Age-related osteoporosis without current pathological fracture: Secondary | ICD-10-CM | POA: Diagnosis not present

## 2020-02-08 DIAGNOSIS — E119 Type 2 diabetes mellitus without complications: Secondary | ICD-10-CM | POA: Diagnosis not present

## 2020-02-08 DIAGNOSIS — I10 Essential (primary) hypertension: Secondary | ICD-10-CM | POA: Diagnosis not present

## 2020-02-08 DIAGNOSIS — Z6834 Body mass index (BMI) 34.0-34.9, adult: Secondary | ICD-10-CM | POA: Diagnosis not present

## 2020-02-09 DIAGNOSIS — R69 Illness, unspecified: Secondary | ICD-10-CM | POA: Diagnosis not present

## 2020-05-14 DIAGNOSIS — M25561 Pain in right knee: Secondary | ICD-10-CM | POA: Diagnosis not present

## 2020-05-14 DIAGNOSIS — E119 Type 2 diabetes mellitus without complications: Secondary | ICD-10-CM | POA: Diagnosis not present

## 2020-05-14 DIAGNOSIS — Z6834 Body mass index (BMI) 34.0-34.9, adult: Secondary | ICD-10-CM | POA: Diagnosis not present

## 2020-05-14 DIAGNOSIS — I10 Essential (primary) hypertension: Secondary | ICD-10-CM | POA: Diagnosis not present

## 2020-05-14 DIAGNOSIS — M25569 Pain in unspecified knee: Secondary | ICD-10-CM | POA: Diagnosis not present

## 2020-06-20 DIAGNOSIS — M1711 Unilateral primary osteoarthritis, right knee: Secondary | ICD-10-CM | POA: Diagnosis not present

## 2020-06-20 DIAGNOSIS — M25561 Pain in right knee: Secondary | ICD-10-CM | POA: Diagnosis not present

## 2020-08-13 DIAGNOSIS — Z0000 Encounter for general adult medical examination without abnormal findings: Secondary | ICD-10-CM | POA: Diagnosis not present

## 2020-08-13 DIAGNOSIS — Z1331 Encounter for screening for depression: Secondary | ICD-10-CM | POA: Diagnosis not present

## 2020-08-13 DIAGNOSIS — M25561 Pain in right knee: Secondary | ICD-10-CM | POA: Diagnosis not present

## 2020-08-13 DIAGNOSIS — K219 Gastro-esophageal reflux disease without esophagitis: Secondary | ICD-10-CM | POA: Diagnosis not present

## 2020-08-13 DIAGNOSIS — E785 Hyperlipidemia, unspecified: Secondary | ICD-10-CM | POA: Diagnosis not present

## 2020-08-13 DIAGNOSIS — E119 Type 2 diabetes mellitus without complications: Secondary | ICD-10-CM | POA: Diagnosis not present

## 2020-08-13 DIAGNOSIS — Z6833 Body mass index (BMI) 33.0-33.9, adult: Secondary | ICD-10-CM | POA: Diagnosis not present

## 2020-08-13 DIAGNOSIS — I10 Essential (primary) hypertension: Secondary | ICD-10-CM | POA: Diagnosis not present

## 2020-08-13 DIAGNOSIS — E782 Mixed hyperlipidemia: Secondary | ICD-10-CM | POA: Diagnosis not present

## 2020-08-13 DIAGNOSIS — Z1159 Encounter for screening for other viral diseases: Secondary | ICD-10-CM | POA: Diagnosis not present

## 2020-08-16 DIAGNOSIS — E119 Type 2 diabetes mellitus without complications: Secondary | ICD-10-CM | POA: Diagnosis not present

## 2020-10-03 DIAGNOSIS — H26493 Other secondary cataract, bilateral: Secondary | ICD-10-CM | POA: Diagnosis not present

## 2020-10-03 DIAGNOSIS — H01004 Unspecified blepharitis left upper eyelid: Secondary | ICD-10-CM | POA: Diagnosis not present

## 2020-10-03 DIAGNOSIS — H01002 Unspecified blepharitis right lower eyelid: Secondary | ICD-10-CM | POA: Diagnosis not present

## 2020-10-03 DIAGNOSIS — H01001 Unspecified blepharitis right upper eyelid: Secondary | ICD-10-CM | POA: Diagnosis not present

## 2020-11-19 DIAGNOSIS — Z6832 Body mass index (BMI) 32.0-32.9, adult: Secondary | ICD-10-CM | POA: Diagnosis not present

## 2020-11-19 DIAGNOSIS — I10 Essential (primary) hypertension: Secondary | ICD-10-CM | POA: Diagnosis not present

## 2020-11-19 DIAGNOSIS — E785 Hyperlipidemia, unspecified: Secondary | ICD-10-CM | POA: Diagnosis not present

## 2020-11-19 DIAGNOSIS — K219 Gastro-esophageal reflux disease without esophagitis: Secondary | ICD-10-CM | POA: Diagnosis not present

## 2020-11-19 DIAGNOSIS — M25561 Pain in right knee: Secondary | ICD-10-CM | POA: Diagnosis not present

## 2020-11-19 DIAGNOSIS — E119 Type 2 diabetes mellitus without complications: Secondary | ICD-10-CM | POA: Diagnosis not present

## 2021-02-26 DIAGNOSIS — Z6831 Body mass index (BMI) 31.0-31.9, adult: Secondary | ICD-10-CM | POA: Diagnosis not present

## 2021-02-26 DIAGNOSIS — M25561 Pain in right knee: Secondary | ICD-10-CM | POA: Diagnosis not present

## 2021-02-26 DIAGNOSIS — R7303 Prediabetes: Secondary | ICD-10-CM | POA: Diagnosis not present

## 2021-02-26 DIAGNOSIS — K219 Gastro-esophageal reflux disease without esophagitis: Secondary | ICD-10-CM | POA: Diagnosis not present

## 2021-02-26 DIAGNOSIS — Z0000 Encounter for general adult medical examination without abnormal findings: Secondary | ICD-10-CM | POA: Diagnosis not present

## 2021-02-26 DIAGNOSIS — E785 Hyperlipidemia, unspecified: Secondary | ICD-10-CM | POA: Diagnosis not present

## 2021-02-26 DIAGNOSIS — I10 Essential (primary) hypertension: Secondary | ICD-10-CM | POA: Diagnosis not present

## 2021-03-27 DIAGNOSIS — Z0100 Encounter for examination of eyes and vision without abnormal findings: Secondary | ICD-10-CM | POA: Diagnosis not present

## 2021-06-04 DIAGNOSIS — E119 Type 2 diabetes mellitus without complications: Secondary | ICD-10-CM | POA: Diagnosis not present

## 2021-06-04 DIAGNOSIS — E785 Hyperlipidemia, unspecified: Secondary | ICD-10-CM | POA: Diagnosis not present

## 2021-06-04 DIAGNOSIS — I10 Essential (primary) hypertension: Secondary | ICD-10-CM | POA: Diagnosis not present

## 2021-06-04 DIAGNOSIS — Z6831 Body mass index (BMI) 31.0-31.9, adult: Secondary | ICD-10-CM | POA: Diagnosis not present

## 2021-06-04 DIAGNOSIS — K219 Gastro-esophageal reflux disease without esophagitis: Secondary | ICD-10-CM | POA: Diagnosis not present

## 2021-09-04 DIAGNOSIS — Z0000 Encounter for general adult medical examination without abnormal findings: Secondary | ICD-10-CM | POA: Diagnosis not present

## 2021-09-04 DIAGNOSIS — E785 Hyperlipidemia, unspecified: Secondary | ICD-10-CM | POA: Diagnosis not present

## 2021-09-04 DIAGNOSIS — E119 Type 2 diabetes mellitus without complications: Secondary | ICD-10-CM | POA: Diagnosis not present

## 2021-09-04 DIAGNOSIS — K219 Gastro-esophageal reflux disease without esophagitis: Secondary | ICD-10-CM | POA: Diagnosis not present

## 2021-09-04 DIAGNOSIS — Z6831 Body mass index (BMI) 31.0-31.9, adult: Secondary | ICD-10-CM | POA: Diagnosis not present

## 2021-09-04 DIAGNOSIS — I10 Essential (primary) hypertension: Secondary | ICD-10-CM | POA: Diagnosis not present

## 2021-10-11 DIAGNOSIS — Z1231 Encounter for screening mammogram for malignant neoplasm of breast: Secondary | ICD-10-CM | POA: Diagnosis not present

## 2021-12-11 DIAGNOSIS — E119 Type 2 diabetes mellitus without complications: Secondary | ICD-10-CM | POA: Diagnosis not present

## 2021-12-11 DIAGNOSIS — Z6831 Body mass index (BMI) 31.0-31.9, adult: Secondary | ICD-10-CM | POA: Diagnosis not present

## 2021-12-11 DIAGNOSIS — Z0000 Encounter for general adult medical examination without abnormal findings: Secondary | ICD-10-CM | POA: Diagnosis not present

## 2021-12-11 DIAGNOSIS — I10 Essential (primary) hypertension: Secondary | ICD-10-CM | POA: Diagnosis not present

## 2021-12-11 DIAGNOSIS — J3081 Allergic rhinitis due to animal (cat) (dog) hair and dander: Secondary | ICD-10-CM | POA: Diagnosis not present

## 2021-12-11 DIAGNOSIS — E785 Hyperlipidemia, unspecified: Secondary | ICD-10-CM | POA: Diagnosis not present

## 2021-12-11 DIAGNOSIS — K219 Gastro-esophageal reflux disease without esophagitis: Secondary | ICD-10-CM | POA: Diagnosis not present

## 2022-02-24 ENCOUNTER — Ambulatory Visit (INDEPENDENT_AMBULATORY_CARE_PROVIDER_SITE_OTHER): Payer: Medicare HMO

## 2022-02-24 ENCOUNTER — Ambulatory Visit (INDEPENDENT_AMBULATORY_CARE_PROVIDER_SITE_OTHER): Payer: Medicare HMO | Admitting: Podiatry

## 2022-02-24 DIAGNOSIS — M79672 Pain in left foot: Secondary | ICD-10-CM

## 2022-02-24 DIAGNOSIS — M722 Plantar fascial fibromatosis: Secondary | ICD-10-CM

## 2022-02-24 MED ORDER — CELECOXIB 200 MG PO CAPS: 200.0000 mg | ORAL_CAPSULE | Freq: Every day | ORAL | 0 refills | Status: DC | PRN

## 2022-02-24 MED ORDER — TRIAMCINOLONE ACETONIDE 10 MG/ML IJ SUSP
10.0000 mg | Freq: Once | INTRAMUSCULAR | Status: AC
  Administered 2022-02-24: 10 mg

## 2022-02-24 NOTE — Progress Notes (Signed)
Subjective:   Patient ID: April Murillo, female   DOB: 71 y.o.   MRN: [id number]   HPI Chief Complaint  Patient presents with   Foot Pain    Left heel pain started 3 weeks ago, patient is walking 2 miles a day, sharp shooting pain the aches, morning pain, Rate of pain 8 out of 10, TX: Soaking and pain meds, X-Rays done today, (would like an injection today) A1c- 6.3 BG- 22     71 year old female presents the above concerns.  When she starts walking it gets better but not going away. When sitting it trobs. She has tried soaking her foot and foot cream but the pain started to get worse. No swelling. She does get burning/numbness to the heel. No radiating pain. No injury but she was wearing a flat flip flop to work and she has since stoppped.    Review of Systems  All other systems reviewed and are negative.  Past Medical History:  Diagnosis Date   Arthritis    Diabetes mellitus without complication ([place])    Hypercholesteremia    Hypertension     Past Surgical History:  Procedure Laterality Date   CATARACT EXTRACTION W/PHACO Right 09/18/2019   Procedure: CATARACT EXTRACTION PHACO AND INTRAOCULAR LENS PLACEMENT RIGHT EYE;  Surgeon: Baruch Goldmann, MD;  Location: [facility];  Service: Ophthalmology;  Laterality: Right;  CDE: 6.15   CATARACT EXTRACTION W/PHACO Left 10/02/2019   Procedure: CATARACT EXTRACTION PHACO AND INTRAOCULAR LENS PLACEMENT ([place]) CDE:;  Surgeon: Baruch Goldmann, MD;  Location: [facility];  Service: Ophthalmology;  Laterality: Left;   CLOSED REDUCTION / MANIPULATION JOINT     COLONOSCOPY       Current Outpatient Medications:    celecoxib (CELEBREX) 200 MG capsule, Take 1 capsule (200 mg total) by mouth daily as needed., Disp: 30 capsule, Rfl: 0   cholecalciferol (VITAMIN D3) 25 MCG (1000 UNIT) tablet, Take 1,000 Units by mouth daily., Disp: , Rfl:    Dulaglutide (TRULICITY) 8.50 YD/7.4JO SOPN, Inject 0.75 mg into the skin every Wednesday. , Disp: , Rfl:    gabapentin  (NEURONTIN) 300 MG capsule, Take 300 mg by mouth at bedtime., Disp: , Rfl:    losartan (COZAAR) 50 MG tablet, Take 50 mg by mouth daily. , Disp: , Rfl:    metFORMIN (GLUCOPHAGE) 1000 MG tablet, Take 1,000 mg by mouth 2 (two) times daily with a meal. , Disp: , Rfl:    Omega-3 Fatty Acids (FISH OIL) 1000 MG CAPS, Take 1,000 mg by mouth in the morning and at bedtime., Disp: , Rfl:    simvastatin (ZOCOR) 10 MG tablet, Take 10 mg by mouth daily. , Disp: , Rfl:    vitamin B-12 (CYANOCOBALAMIN) 1000 MCG tablet, Take 1,000 mcg by mouth daily., Disp: , Rfl:   No Known Allergies         Objective:  Physical Exam  General: AAO x3, NAD  Dermatological: Skin is warm, dry and supple bilateral. There are no open sores, no preulcerative lesions, no rash or signs of infection present.  Vascular: Dorsalis Pedis artery and Posterior Tibial artery pedal pulses are 2/4 bilateral with immedate capillary fill time. There is no pain with calf compression, swelling, warmth, erythema.   Neruologic: Grossly intact via light touch bilateral.  No Tinel sign.  Musculoskeletal: Tenderness to palpation along the plantar medial tubercle of the calcaneus at the insertion of plantar fascia on the left foot. There is no pain along the course of the plantar fascia  within the arch of the foot. Plantar fascia appears to be intact. There is no pain with lateral compression of the calcaneus or pain with vibratory sensation. There is no pain along the course or insertion of the achilles tendon. No other areas of tenderness to bilateral lower extremities.  Gait: Unassisted, Nonantalgic.       Assessment:   Left heel pain, Plantar fasciitis     Plan:  -Treatment options discussed including all alternatives, risks, and complications -Etiology of symptoms were discussed -X-rays were obtained and reviewed with the patient.  3 views of the left foot were obtained.  No evidence of acute fracture.  Calcaneal spurring is  present. -Steroid injection performed.  See procedure note below. -Celebrex to take as needed. -I dispensed a night splint to help stretch the plantar fascia as well as the plantar fascia brace to help support the plantar fascia.  Discussed shoe modifications and arch support.  Discussed stretching, icing daily.  Procedure: Injection Tendon/Ligament Discussed alternatives, risks, complications and verbal consent was obtained.  Location: Left plantar fascia at the glabrous junction; medial approach. Skin Prep: Alcohol. Injectate: 0.5cc 0.5% marcaine plain, 0.5 cc 2% lidocaine plain and, 1 cc kenalog 10. Disposition: Patient tolerated procedure well. Injection site dressed with a band-aid.  Post-injection care was discussed and return precautions discussed.    Return if symptoms worsen or fail to improve.  Trula Slade DPM

## 2022-02-24 NOTE — Patient Instructions (Signed)
If was nice to meet you today. If you have any questions or any further concerns, please feel fee to give me a call. You can call our office at [phone] or please feel fee to send me a message through [place].   ---  For instructions on how to put on your Plantar Fascial Brace, please visit [email or web address]  ----  Plantar Fasciitis (Heel Spur Syndrome) with Rehab The plantar fascia is a fibrous, ligament-like, soft-tissue structure that spans the bottom of the foot. Plantar fasciitis is a condition that causes pain in the foot due to inflammation of the tissue. SYMPTOMS  Pain and tenderness on the underneath side of the foot. Pain that worsens with standing or walking. CAUSES  Plantar fasciitis is caused by irritation and injury to the plantar fascia on the underneath side of the foot. Common mechanisms of injury include: Direct trauma to bottom of the foot. Damage to a small nerve that runs under the foot where the main fascia attaches to the heel bone. Stress placed on the plantar fascia due to bone spurs. RISK INCREASES WITH:  Activities that place stress on the plantar fascia (running, jumping, pivoting, or cutting). Poor strength and flexibility. Improperly fitted shoes. Tight calf muscles. Flat feet. Failure to warm-up properly before activity. Obesity. PREVENTION Warm up and stretch properly before activity. Allow for adequate recovery between workouts. Maintain physical fitness: Strength, flexibility, and endurance. Cardiovascular fitness. Maintain a health body weight. Avoid stress on the plantar fascia. Wear properly fitted shoes, including arch supports for individuals who have flat feet.  PROGNOSIS  If treated properly, then the symptoms of plantar fasciitis usually resolve without surgery. However, occasionally surgery is necessary.  RELATED COMPLICATIONS  Recurrent symptoms that may result in a chronic condition. Problems of the lower back that are  caused by compensating for the injury, such as limping. Pain or weakness of the foot during push-off following surgery. Chronic inflammation, scarring, and partial or complete fascia tear, occurring more often from repeated injections.  TREATMENT  Treatment initially involves the use of ice and medication to help reduce pain and inflammation. The use of strengthening and stretching exercises may help reduce pain with activity, especially stretches of the Achilles tendon. These exercises may be performed at home or with a therapist. Your caregiver may recommend that you use heel cups of arch supports to help reduce stress on the plantar fascia. Occasionally, corticosteroid injections are given to reduce inflammation. If symptoms persist for greater than 6 months despite non-surgical (conservative), then surgery may be recommended.   MEDICATION  If pain medication is necessary, then nonsteroidal anti-inflammatory medications, such as aspirin and ibuprofen, or other minor pain relievers, such as acetaminophen, are often recommended. Do not take pain medication within 7 days before surgery. Prescription pain relievers may be given if deemed necessary by your caregiver. Use only as directed and only as much as you need. Corticosteroid injections may be given by your caregiver. These injections should be reserved for the most serious cases, because they may only be given a certain number of times.  HEAT AND COLD Cold treatment (icing) relieves pain and reduces inflammation. Cold treatment should be applied for 10 to 15 minutes every 2 to 3 hours for inflammation and pain and immediately after any activity that aggravates your symptoms. Use ice packs or massage the area with a piece of ice (ice massage). Heat treatment may be used prior to performing the stretching and strengthening activities prescribed by your caregiver, physical therapist, or  Product/process development scientist. Use a heat pack or soak the injury in warm  water.  SEEK IMMEDIATE MEDICAL CARE IF: Treatment seems to offer no benefit, or the condition worsens. Any medications produce adverse side effects.  EXERCISES- RANGE OF MOTION (ROM) AND STRETCHING EXERCISES - Plantar Fasciitis (Heel Spur Syndrome) These exercises may help you when beginning to rehabilitate your injury. Your symptoms may resolve with or without further involvement from your physician, physical therapist or athletic trainer. While completing these exercises, remember:  Restoring tissue flexibility helps normal motion to return to the joints. This allows healthier, less painful movement and activity. An effective stretch should be held for at least 30 seconds. A stretch should never be painful. You should only feel a gentle lengthening or release in the stretched tissue.  RANGE OF MOTION - Toe Extension, Flexion Sit with your right / left leg crossed over your opposite knee. Grasp your toes and gently pull them back toward the top of your foot. You should feel a stretch on the bottom of your toes and/or foot. Hold this stretch for 10 seconds. Now, gently pull your toes toward the bottom of your foot. You should feel a stretch on the top of your toes and or foot. Hold this stretch for 10 seconds. Repeat  times. Complete this stretch 3 times per day.   RANGE OF MOTION - Ankle Dorsiflexion, Active Assisted Remove shoes and sit on a chair that is preferably not on a carpeted surface. Place right / left foot under knee. Extend your opposite leg for support. Keeping your heel down, slide your right / left foot back toward the chair until you feel a stretch at your ankle or calf. If you do not feel a stretch, slide your bottom forward to the edge of the chair, while still keeping your heel down. Hold this stretch for 10 seconds. Repeat 3 times. Complete this stretch 2 times per day.   STRETCH  Gastroc, Standing Place hands on wall. Extend right / left leg, keeping the front knee  somewhat bent. Slightly point your toes inward on your back foot. Keeping your right / left heel on the floor and your knee straight, shift your weight toward the wall, not allowing your back to arch. You should feel a gentle stretch in the right / left calf. Hold this position for 10 seconds. Repeat 3 times. Complete this stretch 2 times per day.  STRETCH  Soleus, Standing Place hands on wall. Extend right / left leg, keeping the other knee somewhat bent. Slightly point your toes inward on your back foot. Keep your right / left heel on the floor, bend your back knee, and slightly shift your weight over the back leg so that you feel a gentle stretch deep in your back calf. Hold this position for 10 seconds. Repeat 3 times. Complete this stretch 2 times per day.  STRETCH  Gastrocsoleus, Standing  Note: This exercise can place a lot of stress on your foot and ankle. Please complete this exercise only if specifically instructed by your caregiver.  Place the ball of your right / left foot on a step, keeping your other foot firmly on the same step. Hold on to the wall or a rail for balance. Slowly lift your other foot, allowing your body weight to press your heel down over the edge of the step. You should feel a stretch in your right / left calf. Hold this position for 10 seconds. Repeat this exercise with a slight bend in  your right / left knee. Repeat 3 times. Complete this stretch 2 times per day.   STRENGTHENING EXERCISES - Plantar Fasciitis (Heel Spur Syndrome)  These exercises may help you when beginning to rehabilitate your injury. They may resolve your symptoms with or without further involvement from your physician, physical therapist or athletic trainer. While completing these exercises, remember:  Muscles can gain both the endurance and the strength needed for everyday activities through controlled exercises. Complete these exercises as instructed by your physician, physical  therapist or athletic trainer. Progress the resistance and repetitions only as guided.  STRENGTH - Towel Curls Sit in a chair positioned on a non-carpeted surface. Place your foot on a towel, keeping your heel on the floor. Pull the towel toward your heel by only curling your toes. Keep your heel on the floor. Repeat 3 times. Complete this exercise 2 times per day.  STRENGTH - Ankle Inversion Secure one end of a rubber exercise band/tubing to a fixed object (table, pole). Loop the other end around your foot just before your toes. Place your fists between your knees. This will focus your strengthening at your ankle. Slowly, pull your big toe up and in, making sure the band/tubing is positioned to resist the entire motion. Hold this position for 10 seconds. Have your muscles resist the band/tubing as it slowly pulls your foot back to the starting position. Repeat 3 times. Complete this exercises 2 times per day.  Document Released: 05/11/2005 Document Revised: 08/03/2011 Document Reviewed: 08/23/2008 Exit[employer] Patient Information 2014 Exit[employer], [place].  --Celecoxib Capsules What is this medication? CELECOXIB (sell a KOX ib) treats mild to moderate pain, inflammation, or arthritis. It belongs to a group of medications called NSAIDs. This medicine may be used for other purposes; ask your health care provider or pharmacist if you have questions. COMMON BRAND NAME(S): Celebrex What should I tell my care team before I take this medication? They need to know if you have any of these conditions: Bleeding disorders Coronary artery bypass graft (CABG) within the past 2 weeks Heart attack Heart disease Heart failure High blood pressure High levels of potassium in the blood If you often drink alcohol Kidney disease Liver disease Low red blood cell counts Lung or breathing disease (asthma) Receiving steroids like dexamethasone or prednisone Smoke cigarettes Stomach bleeding Stomach or  intestine problems Take medications that treat or prevent blood clots An unusual or allergic reaction to celecoxib, other medications, foods, dyes, or preservatives Pregnant or trying to get pregnant Breast-feeding How should I use this medication? Take this medication by mouth with water. Take it as directed on the prescription label at the same time every day. Do not cut, crush or chew this medication. Swallow the capsules whole. You may open the capsule and put the contents in 1 teaspoon of applesauce. Swallow the medication and applesauce right away. Do not chew the medication or applesauce. Keep taking it unless your care team tells you to stop. A special MedGuide will be given to you by the pharmacist with each prescription and refill. Be sure to read this information carefully each time. Talk to your care team regarding the use of this medication in children. While this medication may be prescribed for children as young as 25 years old for selected conditions, precautions do apply. Patients over 70 years of age may have a stronger reaction and need a smaller dose. Overdosage: If you think you have taken too much of this medicine contact a poison control center or emergency  room at once. NOTE: This medicine is only for you. Do not share this medicine with others. What if I miss a dose? If you miss a dose, take it as soon as you can. If it is almost time for your next dose, take only that dose. Do not take double or extra doses. What may interact with this medication? Do not take this medication with any of the following: Cidofovir Ketorolac Thioridazine This medication may also interact with the following: Alcohol Aspirin and aspirin-like medications Atomoxetine Certain medications for blood pressure, heart disease, irregular heart beat Certain medications for depression, anxiety, or psychotic disorders Certain medications that treat or prevent blood clots like warfarin, enoxaparin,  dalteparin, apixaban, dabigatran, and rivaroxaban Cyclosporine Digoxin Diuretics Fluconazole Lithium Methotrexate Other NSAIDs, medications for pain and inflammation, like ibuprofen or naproxen Pemetrexed Rifampin Steroid medications like prednisone or cortisone This list may not describe all possible interactions. Give your health care provider a list of all the medicines, herbs, non-prescription drugs, or dietary supplements you use. Also tell them if you smoke, drink alcohol, or use illegal drugs. Some items may interact with your medicine. What should I watch for while using this medication? Visit your care team for regular checks on your progress. Tell your care team if your symptoms do not start to get better or if they get worse. Do not take other medications that contain aspirin, ibuprofen, or naproxen with this medication. Side effects such as stomach upset, nausea, or ulcers may be more likely to occur. Many non-prescription medications contain aspirin, ibuprofen, or naproxen. Always read labels carefully. This medication can cause serious ulcers and bleeding in the stomach. It can happen with no warning. Smoking, drinking alcohol, older age, and poor health can also increase risks. Call your care team right away if you have stomach pain or blood in your vomit or stool. This medication does not prevent a heart attack or stroke. This medication may increase the chance of a heart attack or stroke. The chance may increase the longer you use this medication or if you have heart disease. If you take aspirin to prevent a heart attack or stroke, talk to your care team about using this medication. Alcohol may interfere with the effect of this medication. Avoid alcoholic drinks. This medication may cause serious skin reactions. They can happen weeks to months after starting the medication. Contact your care team right away if you notice fevers or flu-like symptoms with a rash. The rash may be red  or purple and then turn into blisters or peeling of the skin. Or, you might notice a red rash with swelling of the face, lips or lymph nodes in your neck or under your arms. Talk to your care team if you are pregnant before taking this medication. Taking this medication between weeks 20 and 30 of pregnancy may harm your unborn baby. Your care team will monitor you closely if you need to take it. After 30 weeks of pregnancy, do not take this medication. You may get drowsy or dizzy. Do not drive, use machinery, or do anything that needs mental alertness until you know how this medication affects you. Do not stand up or sit up quickly, especially if you are an older patient. This reduces the risk of dizzy or fainting spells. Be careful brushing or flossing your teeth or using a toothpick because you may get an infection or bleed more easily. If you have any dental work done, tell your dentist you are receiving this medication.  This medication may make it more difficult to get pregnant. Talk to your care team if you are concerned about your fertility. What side effects may I notice from receiving this medication? Side effects that you should report to your care team as soon as possible: Allergic reactions--skin rash, itching, hives, swelling of the face, lips, tongue, or throat Bleeding--bloody or black, tar-like stools, vomiting blood or brown material that looks like coffee grounds, red or dark brown urine, small red or purple spots on skin, unusual bruising or bleeding Heart attack--pain or tightness in the chest, shoulders, arms, or jaw, nausea, shortness of breath, cold or clammy skin, feeling faint or lightheaded Heart failure--shortness of breath, swelling of ankles, feet, or hands, sudden weight gain, unusual weakness or fatigue Increase in blood pressure Kidney injury--decrease in the amount of urine, swelling of the ankles, hands, or feet Liver injury--right upper belly pain, loss of appetite,  nausea, light-colored stool, dark yellow or brown urine, yellowing skin or eyes, unusual weakness or fatigue Rash, fever, and swollen lymph nodes Redness, blistering, peeling, or loosening of the skin, including inside the mouth Stroke--sudden numbness or weakness of the face, arm, or leg, trouble speaking, confusion, trouble walking, loss of balance or coordination, dizziness, severe headache, change in vision Side effects that usually do not require medical attention (report to your care team if they continue or are bothersome): Headache Loss of appetite Nausea Upset stomach This list may not describe all possible side effects. Call your doctor for medical advice about side effects. You may report side effects to FDA at 1-800-FDA-1088. Where should I keep my medication? Keep out of the reach of children and pets. Store at room temperature between 15 and 30 degrees C (59 and 86 degrees F). Get rid of any unused medication after the expiration date. To get rid of medications that are no longer needed or have expired: Take the medication to a medication take-back program. Check with your pharmacy or law enforcement to find a location. If you cannot return the medication, check the label or package insert to see if the medication should be thrown out in the garbage or flushed down the toilet. If you are not sure, ask your care team. If it is safe to put it in the trash, empty the medication out of the container. Mix the medication with cat litter, dirt, coffee grounds, or other unwanted substance. Seal the mixture in a bag or container. Put it in the trash. NOTE: This sheet is a summary. It may not cover all possible information. If you have questions about this medicine, talk to your doctor, pharmacist, or health care provider.  2023 [employer]vier/Gold Standard (2007-07-02 00:00:00)

## 2022-03-12 DIAGNOSIS — K219 Gastro-esophageal reflux disease without esophagitis: Secondary | ICD-10-CM | POA: Diagnosis not present

## 2022-03-12 DIAGNOSIS — E119 Type 2 diabetes mellitus without complications: Secondary | ICD-10-CM | POA: Diagnosis not present

## 2022-03-12 DIAGNOSIS — E785 Hyperlipidemia, unspecified: Secondary | ICD-10-CM | POA: Diagnosis not present

## 2022-03-12 DIAGNOSIS — Z6831 Body mass index (BMI) 31.0-31.9, adult: Secondary | ICD-10-CM | POA: Diagnosis not present

## 2022-03-12 DIAGNOSIS — I10 Essential (primary) hypertension: Secondary | ICD-10-CM | POA: Diagnosis not present

## 2022-03-12 DIAGNOSIS — J3081 Allergic rhinitis due to animal (cat) (dog) hair and dander: Secondary | ICD-10-CM | POA: Diagnosis not present

## 2022-03-13 ENCOUNTER — Other Ambulatory Visit: Payer: Self-pay | Admitting: Podiatry

## 2022-03-13 DIAGNOSIS — M722 Plantar fascial fibromatosis: Secondary | ICD-10-CM

## 2022-03-27 ENCOUNTER — Other Ambulatory Visit: Payer: Self-pay | Admitting: Podiatry

## 2022-03-31 DIAGNOSIS — E119 Type 2 diabetes mellitus without complications: Secondary | ICD-10-CM | POA: Diagnosis not present

## 2022-06-10 ENCOUNTER — Emergency Department (HOSPITAL_COMMUNITY): Payer: Medicare HMO

## 2022-06-10 ENCOUNTER — Other Ambulatory Visit: Payer: Self-pay

## 2022-06-10 ENCOUNTER — Encounter (HOSPITAL_COMMUNITY): Payer: Self-pay

## 2022-06-10 ENCOUNTER — Emergency Department (HOSPITAL_COMMUNITY)
Admission: EM | Admit: 2022-06-10 | Discharge: 2022-06-10 | Disposition: A | Discharge status: Home / Self Care | Payer: Medicare HMO | Attending: Emergency Medicine | Admitting: Emergency Medicine

## 2022-06-10 DIAGNOSIS — S43014A Anterior dislocation of right humerus, initial encounter: Secondary | ICD-10-CM | POA: Diagnosis not present

## 2022-06-10 DIAGNOSIS — W109XXA Fall (on) (from) unspecified stairs and steps, initial encounter: Secondary | ICD-10-CM | POA: Diagnosis not present

## 2022-06-10 DIAGNOSIS — I10 Essential (primary) hypertension: Secondary | ICD-10-CM | POA: Diagnosis not present

## 2022-06-10 DIAGNOSIS — M19011 Primary osteoarthritis, right shoulder: Secondary | ICD-10-CM | POA: Diagnosis not present

## 2022-06-10 DIAGNOSIS — S4991XA Unspecified injury of right shoulder and upper arm, initial encounter: Secondary | ICD-10-CM | POA: Diagnosis present

## 2022-06-10 DIAGNOSIS — Y9301 Activity, walking, marching and hiking: Secondary | ICD-10-CM | POA: Insufficient documentation

## 2022-06-10 DIAGNOSIS — S42211A Unspecified displaced fracture of surgical neck of right humerus, initial encounter for closed fracture: Secondary | ICD-10-CM | POA: Diagnosis not present

## 2022-06-10 DIAGNOSIS — E119 Type 2 diabetes mellitus without complications: Secondary | ICD-10-CM | POA: Insufficient documentation

## 2022-06-10 DIAGNOSIS — S42251A Displaced fracture of greater tuberosity of right humerus, initial encounter for closed fracture: Secondary | ICD-10-CM | POA: Diagnosis not present

## 2022-06-10 DIAGNOSIS — S42261A Displaced fracture of lesser tuberosity of right humerus, initial encounter for closed fracture: Secondary | ICD-10-CM | POA: Diagnosis not present

## 2022-06-10 DIAGNOSIS — S43004A Unspecified dislocation of right shoulder joint, initial encounter: Secondary | ICD-10-CM | POA: Insufficient documentation

## 2022-06-10 DIAGNOSIS — S4291XA Fracture of right shoulder girdle, part unspecified, initial encounter for closed fracture: Secondary | ICD-10-CM

## 2022-06-10 MED ORDER — OXYCODONE-ACETAMINOPHEN 5-325 MG PO TABS
1.0000 | ORAL_TABLET | Freq: Once | ORAL | Status: AC
  Administered 2022-06-10: 1 via ORAL
  Filled 2022-06-10: qty 1

## 2022-06-10 MED ORDER — IBUPROFEN 600 MG PO TABS: 600.0000 mg | ORAL_TABLET | Freq: Four times a day (QID) | ORAL | 0 refills | Status: DC | PRN

## 2022-06-10 MED ORDER — OXYCODONE HCL 5 MG PO TABS: 5.0000 mg | ORAL_TABLET | Freq: Four times a day (QID) | ORAL | 0 refills | Status: DC | PRN

## 2022-06-10 NOTE — ED Notes (Signed)
Pt d/c home per MD order. Discharge summary reviewed, pt verbalizes understanding. No s/s of acute distress noted, reports daughter is discharge ride home.

## 2022-06-10 NOTE — ED Triage Notes (Signed)
Pt presents to ED for fall down steps this am. Pt tripped walking down steps and fell down approximately 2 steps, c/o right shoulder pain

## 2022-06-10 NOTE — ED Provider Notes (Signed)
[facility] EMERGENCY DEPARTMENT Provider Note  CSN: [id number] Arrival date & time: 06/10/22 1025  Chief Complaint(s) Fall  HPI April Murillo is a 72 y.o. female with history of diabetes, hypertension, hyperlipidemia presenting to the emergency department after fall.  Patient reports that she was walking down steps when she tripped and landed directly on her right shoulder.  She reports pain in the right shoulder but nowhere else.  She denies any numbness or tingling.  Did not hit her head.  No chest or abdominal pain.  No pain in the legs or the left arm.   Past Medical History Past Medical History:  Diagnosis Date   Arthritis    Diabetes mellitus without complication ([place])    Hypercholesteremia    Hypertension    Patient Active Problem List   Diagnosis Date Noted   Bilateral primary osteoarthritis of knee 02/02/2019   Body mass index 36.0-36.9, adult 02/02/2019   Home Medication(s) Prior to Admission medications   Medication Sig Start Date End Date Taking? Authorizing Provider  ibuprofen (ADVIL) 600 MG tablet Take 1 tablet (600 mg total) by mouth every 6 (six) hours as needed. 06/10/22  Yes Cristie Hem, MD  oxyCODONE (ROXICODONE) 5 MG immediate release tablet Take 1 tablet (5 mg total) by mouth every 6 (six) hours as needed for breakthrough pain or severe pain. 06/10/22  Yes Cristie Hem, MD  celecoxib (CELEBREX) 200 MG capsule TAKE 1 CAPSULE (200 MG TOTAL) BY MOUTH DAILY AS NEEDED. 03/30/22   Trula Slade, DPM  cholecalciferol (VITAMIN D3) 25 MCG (1000 UNIT) tablet Take 1,000 Units by mouth daily.    [provider]  Dulaglutide (TRULICITY) 8.52 DP/8.2UM SOPN Inject 0.75 mg into the skin every Wednesday.  12/17/18   [provider]  gabapentin (NEURONTIN) 300 MG capsule Take 300 mg by mouth at bedtime.    [provider]  losartan (COZAAR) 50 MG tablet Take 50 mg by mouth daily.  09/13/18   [provider]  metFORMIN  (GLUCOPHAGE) 1000 MG tablet Take 1,000 mg by mouth 2 (two) times daily with a meal.  01/16/19   [provider]  Omega-3 Fatty Acids (FISH OIL) 1000 MG CAPS Take 1,000 mg by mouth in the morning and at bedtime.    [provider]  simvastatin (ZOCOR) 10 MG tablet Take 10 mg by mouth daily.  11/17/18   [provider]  vitamin B-12 (CYANOCOBALAMIN) 1000 MCG tablet Take 1,000 mcg by mouth daily.    [provider]                                                                                                                                    Past Surgical History Past Surgical History:  Procedure Laterality Date   CATARACT EXTRACTION W/PHACO Right 09/18/2019   Procedure: CATARACT EXTRACTION PHACO AND INTRAOCULAR LENS PLACEMENT RIGHT EYE;  Surgeon: Baruch Goldmann, MD;  Location: [facility];  Service: Ophthalmology;  Laterality: Right;  CDE: 6.15   CATARACT EXTRACTION W/PHACO Left 10/02/2019   Procedure: CATARACT EXTRACTION PHACO AND INTRAOCULAR LENS PLACEMENT ([place]) CDE:;  Surgeon: Baruch Goldmann, MD;  Location: [facility];  Service: Ophthalmology;  Laterality: Left;   CLOSED REDUCTION / MANIPULATION JOINT     COLONOSCOPY     Family History History reviewed. No pertinent family history.  Social History Social History   Tobacco Use   Smoking status: Never   Smokeless tobacco: Never  Vaping Use   Vaping Use: Never used  Substance Use Topics   Alcohol use: Never   Drug use: Never   Allergies Patient has no known allergies.  Review of Systems Review of Systems  All other systems reviewed and are negative.   Physical Exam Vital Signs  I have reviewed the triage vital signs BP 134/83 (BP Location: Left Arm)   Pulse 81   Temp (!) 96.8 F (36 C) (Temporal)   Resp 18   Ht '5\' 6"'$  (1.676 m)   Wt 86.2 kg   SpO2 98%   BMI 30.67 kg/m  Physical Exam Vitals and nursing note reviewed.  Constitutional:      General: She is not in acute distress.    Appearance:  She is well-developed.  HENT:     Head: Normocephalic and atraumatic.     Mouth/Throat:     Mouth: Mucous membranes are moist.  Eyes:     Pupils: Pupils are equal, round, and reactive to light.  Cardiovascular:     Rate and Rhythm: Normal rate and regular rhythm.     Heart sounds: No murmur heard.    Comments: 2+ bilateral radial pulses Pulmonary:     Effort: Pulmonary effort is normal. No respiratory distress.     Breath sounds: Normal breath sounds.  Abdominal:     General: Abdomen is flat.     Palpations: Abdomen is soft.     Tenderness: There is no abdominal tenderness.  Musculoskeletal:        General: No tenderness.     Right lower leg: No edema.     Left lower leg: No edema.     Comments: Holds right arm in adduction, tenderness over the right shoulder.  No tenderness or limited range of motion at the right elbow, wrist, fingers.  Full range of motion of the bilateral lower extremities in the left upper extremity without focal pain or tenderness.  No midline C, T, L-spine tenderness.  Skin:    General: Skin is warm and dry.  Neurological:     General: No focal deficit present.     Mental Status: She is alert. Mental status is at baseline.     Comments: No sensory or strength deficit in the distal median, ulnar, radial nerve distributions, no axillary nerve decrease sensation.  Psychiatric:        Mood and Affect: Mood normal.        Behavior: Behavior normal.     ED Results and Treatments Labs (all labs ordered are listed, but only abnormal results are displayed) Labs Reviewed - No data to display  Radiology CT Shoulder Right Wo Contrast  Result Date: 06/10/2022 CLINICAL DATA:  Right proximal humeral fracture, fall down stairs EXAM: CT OF THE UPPER RIGHT EXTREMITY WITHOUT CONTRAST TECHNIQUE: Multidetector CT imaging of the upper right extremity was  performed according to the standard protocol. RADIATION DOSE REDUCTION: This exam was performed according to the departmental dose-optimization program which includes automated exposure control, adjustment of the mA and/or kV according to patient size and/or use of iterative reconstruction technique. COMPARISON:  Radiographs 06/10/2022 FINDINGS: Bones/Joint/Cartilage Comminuted fracture the right proximal humerus with fracture planes along the anatomic neck and the surgical neck. 4.4 by 2.3 by 4.3 cm fragment consisting of most of the humeral head is displaced 2.1 cm inferiorly from the dominant component of the metaphysis with some varus angulation, and is dislocated about 3 cm inferiorly and mildly anteriorly with respect to the glenoid. Fracture components extend into the greater and lesser tuberosities, with a posterior greater tuberosity fragment displaced 1.3 cm posterolaterally on image 31 series 6. The more anterior greater tuberosity fragment is only displaced about 0.8 cm anteriorly. The small lesser tuberosity fracture is only displaced about 0.3 cm. There is a bony Bankart fracture of the anterior inferior glenoid resulting in a 2.3 by 0.7 by 0.4 cm fragment as shown on image 46 series 6 and image 42 series 4. This is displaced posteromedially by about 1.1 cm. As expected, there is a right glenohumeral lipohemarthrosis. Well corticated ossicles along the ac joint may be from prior fracture or less likely unfused secondary ossification centers. However, these are felt to be chronic. The coracoid appears intact. Ligaments Suboptimally assessed by CT. Muscles and Tendons Expected edema along fascia planes adjacent to the fractures. Soft tissues Unremarkable IMPRESSION: 1. Comminuted fracture of the right proximal humerus involving the anatomic neck, surgical neck, greater and lesser tuberosities. There is also a displaced bony Bankart fracture of the anterior inferior glenoid. The humeral head fragment is  dislocated about 3 cm inferiorly and anteriorly with respect to the glenoid (dislocated) and is about 2.1 cm displaced inferiorly from the dominant proximal metaphyseal/shaft segment of the humerus. 2. Lipohemarthrosis. 3. Well corticated ossicles along the AC joint may be from prior fracture or unfused secondary ossification centers. However, these are felt to be chronic. Electronically Signed   By: Van Clines M.D.   On: 06/10/2022 12:15   DG Shoulder Right  Result Date: 06/10/2022 CLINICAL DATA:  [place] today, pain EXAM: RIGHT SHOULDER - 2+ VIEW COMPARISON:  None Available. FINDINGS: Osseous demineralization. AC joint alignment normal with moderate degenerative changes. Visualized ribs intact. Comminuted displaced fracture involving the head and surgical neck of RIGHT humerus with dislocation of the humeral head fragment. Deformity of the mid RIGHT humeral diaphysis suggesting old healed fracture. No additional osseous abnormality seen. IMPRESSION: Comminuted fracture dislocation RIGHT humeral head/surgical neck. Osseous demineralization with degenerative changes RIGHT AC joint. Deformity of mid RIGHT humeral diaphysis question sequela of remote fracture. Electronically Signed   By: Lavonia Dana M.D.   On: 06/10/2022 11:20    Pertinent labs & imaging results that were available during my care of the patient were reviewed by me and considered in my medical decision making (see MDM for details).  Medications Ordered in ED Medications  oxyCODONE-acetaminophen (PERCOCET/ROXICET) 5-325 MG per tablet 1 tablet (1 tablet Oral Given 06/10/22 1108)  oxyCODONE-acetaminophen (PERCOCET/ROXICET) 5-325 MG per tablet 1 tablet (1 tablet Oral Given 06/10/22 1234)  Procedures Procedures  (including critical care time)  Medical Decision Making / ED Course   MDM:  72 year old  female presenting to the emergency department with fall onto shoulder  X-ray demonstrates right shoulder fracture dislocation with fracture at the humeral neck.  Discussed with Dr. Amedeo Kinsman who requests CT scan of the shoulder, discharged with sling to follow-up with him in 2 days.  Patient will likely need surgical fixation.  No evidence of any other injuries from fall on exam or by history. Patient neurovascularly intact.  Given fracture chance of successful reduction would be almost zero.      Additional history obtained: -External records from outside source obtained and reviewed including: Chart review including previous notes, labs, imaging, consultation notes including PMD visit 03/31/22   Imaging Studies ordered: I ordered imaging studies including XR right shoulder, CT right shoulder On my interpretation imaging demonstrates right proximal humerus fracture/dislocation I independently visualized and interpreted imaging. I agree with the radiologist interpretation   Medicines ordered and prescription drug management: Meds ordered this encounter  Medications   oxyCODONE-acetaminophen (PERCOCET/ROXICET) 5-325 MG per tablet 1 tablet   oxyCODONE-acetaminophen (PERCOCET/ROXICET) 5-325 MG per tablet 1 tablet   oxyCODONE (ROXICODONE) 5 MG immediate release tablet    Sig: Take 1 tablet (5 mg total) by mouth every 6 (six) hours as needed for breakthrough pain or severe pain.    Dispense:  15 tablet    Refill:  0   ibuprofen (ADVIL) 600 MG tablet    Sig: Take 1 tablet (600 mg total) by mouth every 6 (six) hours as needed.    Dispense:  30 tablet    Refill:  0    -I have reviewed the patients home medicines and have made adjustments as needed   Consultations Obtained: I requested consultation with the orthopedic surgeon Dr. Amedeo Kinsman,  and discussed lab and imaging findings as well as pertinent plan - they recommend: CT shoulder, sling, and f/u with him in 2 days   Social Determinants of  Health:  Diagnosis or treatment significantly limited by social determinants of health: obesity   Reevaluation: After the interventions noted above, I reevaluated the patient and found that they have improved  Co morbidities that complicate the patient evaluation  Past Medical History:  Diagnosis Date   Arthritis    Diabetes mellitus without complication ([place])    Hypercholesteremia    Hypertension       Dispostion: Disposition decision including need for hospitalization was considered, and patient discharged from emergency department.    Final Clinical Impression(s) / ED Diagnoses Final diagnoses:  Closed fracture dislocation of shoulder, right, initial encounter     This chart was dictated using voice recognition software.  Despite best efforts to proofread,  errors can occur which can change the documentation meaning.    Cristie Hem, MD 06/10/22 1314

## 2022-06-10 NOTE — ED Notes (Signed)
Patient transported to CT 

## 2022-06-10 NOTE — Discharge Instructions (Addendum)
We evaluated you for your right shoulder pain.  Your x-ray and CT scan showed a fracture of your right shoulder (humerus).  Your shoulder was also dislocated.  We discussed this fracture with the orthopedic doctor on-call, Dr. Amedeo Kinsman, who would like to see you in clinic on Friday to discuss treatment options.  This fracture will often need surgery.  Please take 1000 mg of Tylenol every 6 hours.  Please also take 600 mg of Motrin every 6 hours.  I have prescribed you oxycodone, you can take 5 mg every 6 hours as needed if your pain is not relieved with Tylenol and Motrin.  Please do not take oxycodone with alcohol or operate heavy machinery, drive a vehicle, go swimming while under the influence of this medication as it may make you sleepy. Do not take more than prescribed.   you can also apply ice to your shoulder and try to keep it elevated is much as you can.  We have provided you with a sling, please keep your sling on until you see Dr. Amedeo Kinsman.   Please return to the emergency department if you develop any severe or uncontrolled pain, tingling or weakness in your hand, or any other symptoms which could be related to your fall such as severe headaches, vomiting, severe chest or back pain, abdominal pain, or any other concerning symptoms.

## 2022-06-12 ENCOUNTER — Encounter: Payer: Self-pay | Admitting: Orthopedic Surgery

## 2022-06-12 ENCOUNTER — Ambulatory Visit: Payer: Medicare HMO | Admitting: Orthopedic Surgery

## 2022-06-12 VITALS — BP 127/70 | HR 81 | Ht 66.0 in | Wt 190.0 lb

## 2022-06-12 DIAGNOSIS — Z01818 Encounter for other preprocedural examination: Secondary | ICD-10-CM

## 2022-06-12 DIAGNOSIS — S42291A Other displaced fracture of upper end of right humerus, initial encounter for closed fracture: Secondary | ICD-10-CM | POA: Diagnosis not present

## 2022-06-12 NOTE — Patient Instructions (Signed)
Plan for surgery on 06/22/22  Reverse shoulder arthroplasty for a fracture  Hospital will call you to schedule a preop visit.   Please call if you have any issues

## 2022-06-13 NOTE — Progress Notes (Signed)
New Patient Visit  Assessment: April Murillo is a 72 y.o. female with the following: 1. Closed 4-part fracture of proximal end of right humerus, initial encounter  Plan: Pleas Patricia sustained a right proximal humerus fracture, including dislocation of the humeral head articular fragment.  Given the severity of her injury, including comminution and dislocation of the humeral head, I do not think this is amenable to operative fixation.  As such, I am recommending a reverse shoulder arthroplasty for fracture.  Procedure was discussed in full detail.  All questions were answered.  She has elected to proceed.  Risks and benefits of surgery, including, but not limited to infection, bleeding, persistent pain, damage to surrounding structures, need for further surgery, malunion, nonunion and more severe complications associated with anesthesia were discussed.  All questions have been answered and they have elected to proceed with surgery.   Surgery will be scheduled for June 22, 2022.  This will require an overnight stay following surgery.  I will see her back in clinic approximate 1 week after surgery.    Follow-up: Return in about 18 days (around 06/30/2022) for After surgery; DOS 06/22/22.  Subjective:  Chief Complaint  Patient presents with   Fracture    Rt shoulder DOI 06/10/22    History of Present Illness: April Murillo is a 72 y.o. female who presents for evaluation of right shoulder pain.  Just a few days ago, she was walking down some steps.  She lost her balance.  She consciously turned in order to brace her fall.  She landed directly on her right shoulder.  She had immediate pain.  She presented to the emergency department.  In the ED, x-rays demonstrated a comminuted fracture of the proximal humerus, with dislocation of the humeral head.  Given the nature of the injury, there was no attempt at reduction.  She will presents to clinic today wearing a sling.  She is only  taking over-the-counter medications.  She is right-handed.  She lives alone.   Review of Systems: No fevers or chills No numbness or tingling No chest pain No shortness of breath No bowel or bladder dysfunction No GI distress No headaches   Medical History:  Past Medical History:  Diagnosis Date   Arthritis    Diabetes mellitus without complication ([place])    Hypercholesteremia    Hypertension     Past Surgical History:  Procedure Laterality Date   CATARACT EXTRACTION W/PHACO Right 09/18/2019   Procedure: CATARACT EXTRACTION PHACO AND INTRAOCULAR LENS PLACEMENT RIGHT EYE;  Surgeon: Baruch Goldmann, MD;  Location: [facility];  Service: Ophthalmology;  Laterality: Right;  CDE: 6.15   CATARACT EXTRACTION W/PHACO Left 10/02/2019   Procedure: CATARACT EXTRACTION PHACO AND INTRAOCULAR LENS PLACEMENT ([place]) CDE:;  Surgeon: Baruch Goldmann, MD;  Location: [facility];  Service: Ophthalmology;  Laterality: Left;   CLOSED REDUCTION / MANIPULATION JOINT     COLONOSCOPY      History reviewed. No pertinent family history. Social History   Tobacco Use   Smoking status: Never   Smokeless tobacco: Never  Vaping Use   Vaping Use: Never used  Substance Use Topics   Alcohol use: Never   Drug use: Never    No Known Allergies  Current Meds  Medication Sig   celecoxib (CELEBREX) 200 MG capsule TAKE 1 CAPSULE (200 MG TOTAL) BY MOUTH DAILY AS NEEDED.   Dulaglutide (TRULICITY) 7.42 VZ/5.6LO SOPN Inject 0.75 mg into the skin every Wednesday.    gabapentin (NEURONTIN)  300 MG capsule Take 300 mg by mouth at bedtime.   ibuprofen (ADVIL) 600 MG tablet Take 1 tablet (600 mg total) by mouth every 6 (six) hours as needed.   losartan (COZAAR) 50 MG tablet Take 50 mg by mouth daily.    metFORMIN (GLUCOPHAGE) 1000 MG tablet Take 1,000 mg by mouth 2 (two) times daily with a meal.    Omega-3 Fatty Acids (FISH OIL) 1000 MG CAPS Take 1,000 mg by mouth in the morning and at bedtime.   oxyCODONE (ROXICODONE) 5 MG  immediate release tablet Take 1 tablet (5 mg total) by mouth every 6 (six) hours as needed for breakthrough pain or severe pain.   simvastatin (ZOCOR) 10 MG tablet Take 10 mg by mouth daily.     Objective: BP 127/70   Pulse 81   Ht '5\' 6"'$  (1.676 m)   Wt 190 lb (86.2 kg)   BMI 30.67 kg/m   Physical Exam:  General: Alert and oriented. and No acute distress. Gait: Normal gait.  Evaluation of the right shoulder demonstrates a deformity in this anterior aspect.  There is associated bruising and swelling.  She has not intact sensation in the axillary nerve distribution.  Intact sensation throughout the right hand.  2+ radial pulse.  Diffuse swelling in the upper extremity, without weeping.  IMAGING: I personally reviewed images previously obtained from the ED  X-rays demonstrate a right proximal humerus fracture dislocation.  CT scan:  IMPRESSION: 1. Comminuted fracture of the right proximal humerus involving the anatomic neck, surgical neck, greater and lesser tuberosities. There is also a displaced bony Bankart fracture of the anterior inferior glenoid. The humeral head fragment is dislocated about 3 cm inferiorly and anteriorly with respect to the glenoid (dislocated) and is about 2.1 cm displaced inferiorly from the dominant proximal metaphyseal/shaft segment of the humerus. 2. Lipohemarthrosis. 3. Well corticated ossicles along the AC joint may be from prior fracture or unfused secondary ossification centers. However, these are felt to be chronic.   New Medications:  No orders of the defined types were placed in this encounter.     Mordecai Rasmussen, MD  06/13/2022 4:19 PM

## 2022-06-13 NOTE — H&P (View-Only) (Signed)
New Patient Visit  Assessment: April Murillo is a 72 y.o. female with the following: 1. Closed 4-part fracture of proximal end of right humerus, initial encounter  Plan: April Murillo sustained a right proximal humerus fracture, including dislocation of the humeral head articular fragment.  Given the severity of her injury, including comminution and dislocation of the humeral head, I do not think this is amenable to operative fixation.  As such, I am recommending a reverse shoulder arthroplasty for fracture.  Procedure was discussed in full detail.  All questions were answered.  She has elected to proceed.  Risks and benefits of surgery, including, but not limited to infection, bleeding, persistent pain, damage to surrounding structures, need for further surgery, malunion, nonunion and more severe complications associated with anesthesia were discussed.  All questions have been answered and they have elected to proceed with surgery.   Surgery will be scheduled for June 22, 2022.  This will require an overnight stay following surgery.  I will see her back in clinic approximate 1 week after surgery.    Follow-up: Return in about 18 days (around 06/30/2022) for After surgery; DOS 06/22/22.  Subjective:  Chief Complaint  Patient presents with   Fracture    Rt shoulder DOI 06/10/22    History of Present Illness: April Murillo is a 72 y.o. female who presents for evaluation of right shoulder pain.  Just a few days ago, she was walking down some steps.  She lost her balance.  She consciously turned in order to brace her fall.  She landed directly on her right shoulder.  She had immediate pain.  She presented to the emergency department.  In the ED, x-rays demonstrated a comminuted fracture of the proximal humerus, with dislocation of the humeral head.  Given the nature of the injury, there was no attempt at reduction.  She will presents to clinic today wearing a sling.  She is only  taking over-the-counter medications.  She is right-handed.  She lives alone.   Review of Systems: No fevers or chills No numbness or tingling No chest pain No shortness of breath No bowel or bladder dysfunction No GI distress No headaches   Medical History:  Past Medical History:  Diagnosis Date   Arthritis    Diabetes mellitus without complication ([place])    Hypercholesteremia    Hypertension     Past Surgical History:  Procedure Laterality Date   CATARACT EXTRACTION W/PHACO Right 09/18/2019   Procedure: CATARACT EXTRACTION PHACO AND INTRAOCULAR LENS PLACEMENT RIGHT EYE;  Surgeon: Baruch Goldmann, MD;  Location: [facility];  Service: Ophthalmology;  Laterality: Right;  CDE: 6.15   CATARACT EXTRACTION W/PHACO Left 10/02/2019   Procedure: CATARACT EXTRACTION PHACO AND INTRAOCULAR LENS PLACEMENT ([place]) CDE:;  Surgeon: Baruch Goldmann, MD;  Location: [facility];  Service: Ophthalmology;  Laterality: Left;   CLOSED REDUCTION / MANIPULATION JOINT     COLONOSCOPY      History reviewed. No pertinent family history. Social History   Tobacco Use   Smoking status: Never   Smokeless tobacco: Never  Vaping Use   Vaping Use: Never used  Substance Use Topics   Alcohol use: Never   Drug use: Never    No Known Allergies  Current Meds  Medication Sig   celecoxib (CELEBREX) 200 MG capsule TAKE 1 CAPSULE (200 MG TOTAL) BY MOUTH DAILY AS NEEDED.   Dulaglutide (TRULICITY) 5.28 UX/3.2GM SOPN Inject 0.75 mg into the skin every Wednesday.    gabapentin (NEURONTIN)  300 MG capsule Take 300 mg by mouth at bedtime.   ibuprofen (ADVIL) 600 MG tablet Take 1 tablet (600 mg total) by mouth every 6 (six) hours as needed.   losartan (COZAAR) 50 MG tablet Take 50 mg by mouth daily.    metFORMIN (GLUCOPHAGE) 1000 MG tablet Take 1,000 mg by mouth 2 (two) times daily with a meal.    Omega-3 Fatty Acids (FISH OIL) 1000 MG CAPS Take 1,000 mg by mouth in the morning and at bedtime.   oxyCODONE (ROXICODONE) 5 MG  immediate release tablet Take 1 tablet (5 mg total) by mouth every 6 (six) hours as needed for breakthrough pain or severe pain.   simvastatin (ZOCOR) 10 MG tablet Take 10 mg by mouth daily.     Objective: BP 127/70   Pulse 81   Ht '5\' 6"'$  (1.676 m)   Wt 190 lb (86.2 kg)   BMI 30.67 kg/m   Physical Exam:  General: Alert and oriented. and No acute distress. Gait: Normal gait.  Evaluation of the right shoulder demonstrates a deformity in this anterior aspect.  There is associated bruising and swelling.  She has not intact sensation in the axillary nerve distribution.  Intact sensation throughout the right hand.  2+ radial pulse.  Diffuse swelling in the upper extremity, without weeping.  IMAGING: I personally reviewed images previously obtained from the ED  X-rays demonstrate a right proximal humerus fracture dislocation.  CT scan:  IMPRESSION: 1. Comminuted fracture of the right proximal humerus involving the anatomic neck, surgical neck, greater and lesser tuberosities. There is also a displaced bony Bankart fracture of the anterior inferior glenoid. The humeral head fragment is dislocated about 3 cm inferiorly and anteriorly with respect to the glenoid (dislocated) and is about 2.1 cm displaced inferiorly from the dominant proximal metaphyseal/shaft segment of the humerus. 2. Lipohemarthrosis. 3. Well corticated ossicles along the AC joint may be from prior fracture or unfused secondary ossification centers. However, these are felt to be chronic.   New Medications:  No orders of the defined types were placed in this encounter.     Mordecai Rasmussen, MD  06/13/2022 4:19 PM

## 2022-06-16 ENCOUNTER — Telehealth: Payer: Self-pay | Admitting: Orthopedic Surgery

## 2022-06-16 NOTE — Telephone Encounter (Signed)
Patient lvm requesting to speak to [facility] regarding her upcoming surgery.  She stated that she needs to have her pain medication changed.  Pt's # [id number]

## 2022-06-16 NOTE — Patient Instructions (Addendum)
April Murillo  06/16/2022     '@PREFPERIOPPHARMACY'$ @   Your procedure is scheduled on  06/22/2022.   Report to [facility] at  0600  A.M.   Call this number if you have problems the morning of surgery:  [phone]         Your last dose of dulaglutide should be on 06/14/2022.      DO NOT take any medications for diabetes the morning of your procedure.    If you experience any cold or flu symptoms such as cough, fever, chills, shortness of breath, etc. between now and your scheduled surgery, please notify us at the above number.   Remember:  Do not eat after midnight.   You may drink clear liquids until  0330 am on 06/22/2022.    Clear liquids allowed are:                    Water, Juice (No red color; non-citric and without pulp; diabetics please choose diet or no sugar options), Carbonated beverages (diabetics please choose diet or no sugar options), Clear Tea (No creamer, milk, or cream, including half & half and powdered creamer), Black Coffee Only (No creamer, milk or cream, including half & half and powdered creamer), Plain Jell-O Only (No red color; diabetics please choose no sugar options), Clear Sports drink (No red color; diabetics please choose diet or no sugar options), and Plain Popsicles Only (No red color; diabetics please choose no sugar options)         At 0330  am on 06/22/2022 drink your carb drink. You may have nothing else to drink after this.    Take these medicines the morning of surgery with A SIP OF WATER                         amlodipine, omeprazole.     Do not wear jewelry, make-up or nail polish.  Do not wear lotions, powders, or perfumes, or deodorant.  Do not shave 48 hours prior to surgery.  Men may shave face and neck.  Do not bring valuables to the hospital.  [facility] is not responsible for any belongings or valuables.  Contacts, dentures or bridgework may not be worn into surgery.  Leave your suitcase in the car.  After surgery  it may be brought to your room.  For patients admitted to the hospital, discharge time will be determined by your treatment team.  Patients discharged the day of surgery will not be allowed to drive home and must have someone with them for 24 hours.    Special instructions:   DO NOT smoke tobacco or vape for 24 hours before your procedure.  Please read over the following fact sheets that you were given. Pain Booklet, Coughing and Deep Breathing, Surgical Site Infection Prevention, Anesthesia Post-op Instructions, and Care and Recovery After Surgery         Reverse Total Shoulder Replacement, Care After This sheet gives you information about how to care for yourself after your procedure. Your health care provider may also give you more specific instructions. If you have problems or questions, contact your health care provider. What can I expect after the procedure? After the procedure, it is common to have: Pain in the shoulder and arm. Stiffness in the shoulder and arm. Follow these instructions at home: Medicines Take over-the-counter and prescription medicines only as told by your health care provider.  Ask your health care provider if the medicine prescribed to you: Requires you to avoid driving or using machinery. Can cause constipation. You may need to take these actions to prevent or treat constipation: Drink enough fluid to keep your urine pale yellow. Take over-the-counter or prescription medicines. Eat foods that are high in fiber, such as beans, whole grains, and fresh fruits and vegetables. Limit foods that are high in fat and processed sugars, such as fried or sweet foods. If you have a sling: Wear the sling as told by your health care provider. Remove it only as told by your health care provider. Check the skin around your sling every day. Tell your health care provider about any concerns. Loosen the sling if your fingers tingle, become numb, or turn cold and  blue. Keep the sling clean and dry. Bathing Do not take baths, swim, or use a hot tub until your health care provider approves. Ask your health care provider if you may take showers. You may only be allowed to take sponge baths. If the sling is not waterproof: Do not let it get wet. Cover it with a watertight covering when you take a bath or shower. Keep your bandage (dressing) dry until your health care provider says it can be removed. Incision care  Follow instructions from your health care provider about how to take care of your incision. Make sure you: Wash your hands with soap and water for at least 20 seconds before and after you change your dressing. If soap and water are not available, use hand sanitizer. Change your dressing as told by your health care provider. Leave stitches (sutures), skin glue, or adhesive strips in place. These skin closures may need to stay in place for 2 weeks or longer. If adhesive strip edges start to loosen and curl up, you may trim the loose edges. Do not remove adhesive strips completely unless your health care provider tells you to do that. Check your incision area every day for signs of infection. Check for: More redness, swelling, or pain. Fluid or blood. Warmth. Pus or a bad smell. Managing pain, stiffness, and swelling  If directed, put ice on your shoulder. To do this: If you have a removable sling, remove it as told by your health care provider. Put ice in a plastic bag. Place a towel between your skin and the bag. Leave the ice on for 20 minutes, 2-3 times a day. Remove the ice if your skin turns bright red. This is very important. If you cannot feel pain, heat, or cold, you have a greater risk of damage to the area. Move your fingers and hand often to reduce stiffness and swelling. Driving If you were given a sedative during the procedure, it can affect you for several hours. Do not drive or operate machinery until your health care provider  says that it is safe. Ask your health care provider when it is safe to drive if you have a sling on your arm. Activity Return to your normal activities as told by your health care provider. Ask your health care provider what activities are safe for you. Do shoulder exercises as told by your health care provider. Do not lift your arm above shoulder level until your health care provider approves. Do not make large movements with your arm. Do not push or pull things until your health care provider approves. Do not lift anything that is heavier than 5 lb (2.3 kg) until your health care provider says that  it is safe. General instructions Do not use any products that contain nicotine or tobacco, such as cigarettes, e-cigarettes, and chewing tobacco. These can delay healing after surgery. If you need help quitting, ask your health care provider. Tell your health care provider if you plan to have dental work. Also: Tell your dentist about your joint replacement. Ask your health care provider if there are any special instructions you need to follow before having dental care and routine cleanings. Keep all follow-up visits. This is important. Contact a health care provider if: You feel nauseous or you vomit. Your arm tingles or feels numb. Your pain gets worse, even after taking pain medicine. You have any of these signs of infection: More redness, swelling, or pain around your incision. Fluid or blood coming from your incision. Warmth coming from your incision. Pus or a bad smell coming from your incision. A fever. Get help right away if: Your shoulder joint moves out of place. Your incision comes apart. You have redness, swelling, pain, or warmth in your leg or arm. You have chest pain or shortness of breath. These symptoms may represent a serious problem that is an emergency. Do not wait to see if the symptoms will go away. Get medical help right away. Call your local emergency services (911  in the [place] Do not drive yourself to the hospital. Summary After the procedure, it is common to have some pain and stiffness. Take over-the-counter and prescription medicines only as told by your health care provider. Keep your bandage (dressing) dry until your health care provider says it can be removed. Know the symptoms that should prompt you to contact your health care provider. Do shoulder exercises as told by your health care provider. Ask your health care provider what activities are safe for you. This information is not intended to replace advice given to you by your health care provider. Make sure you discuss any questions you have with your health care provider. Document Revised: 10/25/2019 Document Reviewed: 10/25/2019 Elsevier Patient Education  [place] Anesthesia, Adult, Care After The following information offers guidance on how to care for yourself after your procedure. Your health care provider may also give you more specific instructions. If you have problems or questions, contact your health care provider. What can I expect after the procedure? After the procedure, it is common for people to: Have pain or discomfort at the IV site. Have nausea or vomiting. Have a sore throat or hoarseness. Have trouble concentrating. Feel cold or chills. Feel weak, sleepy, or tired (fatigue). Have soreness and body aches. These can affect parts of the body that were not involved in surgery. Follow these instructions at home: For the time period you were told by your health care provider:  Rest. Do not participate in activities where you could fall or become injured. Do not drive or use machinery. Do not drink alcohol. Do not take sleeping pills or medicines that cause drowsiness. Do not make important decisions or sign legal documents. Do not take care of children on your own. General instructions Drink enough fluid to keep your urine pale yellow. If you have  sleep apnea, surgery and certain medicines can increase your risk for breathing problems. Follow instructions from your health care provider about wearing your sleep device: Anytime you are sleeping, including during daytime naps. While taking prescription pain medicines, sleeping medicines, or medicines that make you drowsy. Return to your normal activities as told by your health care provider. Ask your  health care provider what activities are safe for you. Take over-the-counter and prescription medicines only as told by your health care provider. Do not use any products that contain nicotine or tobacco. These products include cigarettes, chewing tobacco, and vaping devices, such as e-cigarettes. These can delay incision healing after surgery. If you need help quitting, ask your health care provider. Contact a health care provider if: You have nausea or vomiting that does not get better with medicine. You vomit every time you eat or drink. You have pain that does not get better with medicine. You cannot urinate or have bloody urine. You develop a skin rash. You have a fever. Get help right away if: You have trouble breathing. You have chest pain. You vomit blood. These symptoms may be an emergency. Get help right away. Call 911. Do not wait to see if the symptoms will go away. Do not drive yourself to the hospital. Summary After the procedure, it is common to have a sore throat, hoarseness, nausea, vomiting, or to feel weak, sleepy, or fatigue. For the time period you were told by your health care provider, do not drive or use machinery. Get help right away if you have difficulty breathing, have chest pain, or vomit blood. These symptoms may be an emergency. This information is not intended to replace advice given to you by your health care provider. Make sure you discuss any questions you have with your health care provider. Document Revised: 08/08/2021 Document Reviewed:  08/08/2021 Elsevier Patient Education  [place]. How to Use Chlorhexidine Before Surgery Chlorhexidine gluconate (CHG) is a germ-killing (antiseptic) solution that is used to clean the skin. It can get rid of the bacteria that normally live on the skin and can keep them away for about 24 hours. To clean your skin with CHG, you may be given: A CHG solution to use in the shower or as part of a sponge bath. A prepackaged cloth that contains CHG. Cleaning your skin with CHG may help lower the risk for infection: While you are staying in the intensive care unit of the hospital. If you have a vascular access, such as a central line, to provide short-term or long-term access to your veins. If you have a catheter to drain urine from your bladder. If you are on a ventilator. A ventilator is a machine that helps you breathe by moving air in and out of your lungs. After surgery. What are the risks? Risks of using CHG include: A skin reaction. Hearing loss, if CHG gets in your ears and you have a perforated eardrum. Eye injury, if CHG gets in your eyes and is not rinsed out. The CHG product catching fire. Make sure that you avoid smoking and flames after applying CHG to your skin. Do not use CHG: If you have a chlorhexidine allergy or have previously reacted to chlorhexidine. On babies younger than 71 months of age. How to use CHG solution Use CHG only as told by your health care provider, and follow the instructions on the label. Use the full amount of CHG as directed. Usually, this is one bottle. During a shower Follow these steps when using CHG solution during a shower (unless your health care provider gives you different instructions): Start the shower. Use your normal soap and shampoo to wash your face and hair. Turn off the shower or move out of the shower stream. Pour the CHG onto a clean washcloth. Do not use any type of brush or rough-edged sponge.  Starting at your neck, lather  your body down to your toes. Make sure you follow these instructions: If you will be having surgery, pay special attention to the part of your body where you will be having surgery. Scrub this area for at least 1 minute. Do not use CHG on your head or face. If the solution gets into your ears or eyes, rinse them well with water. Avoid your genital area. Avoid any areas of skin that have broken skin, cuts, or scrapes. Scrub your back and under your arms. Make sure to wash skin folds. Let the lather sit on your skin for 1-2 minutes or as long as told by your health care provider. Thoroughly rinse your entire body in the shower. Make sure that all body creases and crevices are rinsed well. Dry off with a clean towel. Do not put any substances on your body afterward--such as powder, lotion, or perfume--unless you are told to do so by your health care provider. Only use lotions that are recommended by the manufacturer. Put on clean clothes or pajamas. If it is the night before your surgery, sleep in clean sheets.  During a sponge bath Follow these steps when using CHG solution during a sponge bath (unless your health care provider gives you different instructions): Use your normal soap and shampoo to wash your face and hair. Pour the CHG onto a clean washcloth. Starting at your neck, lather your body down to your toes. Make sure you follow these instructions: If you will be having surgery, pay special attention to the part of your body where you will be having surgery. Scrub this area for at least 1 minute. Do not use CHG on your head or face. If the solution gets into your ears or eyes, rinse them well with water. Avoid your genital area. Avoid any areas of skin that have broken skin, cuts, or scrapes. Scrub your back and under your arms. Make sure to wash skin folds. Let the lather sit on your skin for 1-2 minutes or as long as told by your health care provider. Using a different clean, wet  washcloth, thoroughly rinse your entire body. Make sure that all body creases and crevices are rinsed well. Dry off with a clean towel. Do not put any substances on your body afterward--such as powder, lotion, or perfume--unless you are told to do so by your health care provider. Only use lotions that are recommended by the manufacturer. Put on clean clothes or pajamas. If it is the night before your surgery, sleep in clean sheets. How to use CHG prepackaged cloths Only use CHG cloths as told by your health care provider, and follow the instructions on the label. Use the CHG cloth on clean, dry skin. Do not use the CHG cloth on your head or face unless your health care provider tells you to. When washing with the CHG cloth: Avoid your genital area. Avoid any areas of skin that have broken skin, cuts, or scrapes. Before surgery Follow these steps when using a CHG cloth to clean before surgery (unless your health care provider gives you different instructions): Using the CHG cloth, vigorously scrub the part of your body where you will be having surgery. Scrub using a back-and-forth motion for 3 minutes. The area on your body should be completely wet with CHG when you are done scrubbing. Do not rinse. Discard the cloth and let the area air-dry. Do not put any substances on the area afterward, such as powder, lotion,  or perfume. Put on clean clothes or pajamas. If it is the night before your surgery, sleep in clean sheets.  For general bathing Follow these steps when using CHG cloths for general bathing (unless your health care provider gives you different instructions). Use a separate CHG cloth for each area of your body. Make sure you wash between any folds of skin and between your fingers and toes. Wash your body in the following order, switching to a new cloth after each step: The front of your neck, shoulders, and chest. Both of your arms, under your arms, and your hands. Your stomach and  groin area, avoiding the genitals. Your right leg and foot. Your left leg and foot. The back of your neck, your back, and your buttocks. Do not rinse. Discard the cloth and let the area air-dry. Do not put any substances on your body afterward--such as powder, lotion, or perfume--unless you are told to do so by your health care provider. Only use lotions that are recommended by the manufacturer. Put on clean clothes or pajamas. Contact a health care provider if: Your skin gets irritated after scrubbing. You have questions about using your solution or cloth. You swallow any chlorhexidine. Call your local poison control center (1-872-170-9493 in the [place] Get help right away if: Your eyes itch badly, or they become very red or swollen. Your skin itches badly and is red or swollen. Your hearing changes. You have trouble seeing. You have swelling or tingling in your mouth or throat. You have trouble breathing. These symptoms may represent a serious problem that is an emergency. Do not wait to see if the symptoms will go away. Get medical help right away. Call your local emergency services (911 in the [place] Do not drive yourself to the hospital. Summary Chlorhexidine gluconate (CHG) is a germ-killing (antiseptic) solution that is used to clean the skin. Cleaning your skin with CHG may help to lower your risk for infection. You may be given CHG to use for bathing. It may be in a bottle or in a prepackaged cloth to use on your skin. Carefully follow your health care provider's instructions and the instructions on the product label. Do not use CHG if you have a chlorhexidine allergy. Contact your health care provider if your skin gets irritated after scrubbing. This information is not intended to replace advice given to you by your health care provider. Make sure you discuss any questions you have with your health care provider. Document Revised: 09/08/2021 Document Reviewed: 07/22/2020 Elsevier  Patient Education  [place].

## 2022-06-17 ENCOUNTER — Other Ambulatory Visit: Payer: Self-pay

## 2022-06-17 DIAGNOSIS — S42291A Other displaced fracture of upper end of right humerus, initial encounter for closed fracture: Secondary | ICD-10-CM

## 2022-06-17 MED ORDER — ONDANSETRON HCL 4 MG PO TABS: 4.0000 mg | ORAL_TABLET | Freq: Three times a day (TID) | ORAL | 0 refills | Status: DC | PRN

## 2022-06-17 MED ORDER — HYDROCODONE-ACETAMINOPHEN 5-325 MG PO TABS: 1.0000 | ORAL_TABLET | Freq: Four times a day (QID) | ORAL | 0 refills | Status: DC | PRN

## 2022-06-17 NOTE — Telephone Encounter (Signed)
Spoke with pt daughter. States medication is making pt sick to the stomach, plus it has her "wired' and she can't sleep. [facility] is still the pharmacy of choice.

## 2022-06-18 ENCOUNTER — Telehealth: Payer: Self-pay | Admitting: Orthopedic Surgery

## 2022-06-18 ENCOUNTER — Encounter (HOSPITAL_COMMUNITY)
Admission: RE | Admit: 2022-06-18 | Discharge: 2022-06-18 | Disposition: A | Discharge status: Home / Self Care | Payer: Medicare HMO | Source: Ambulatory Visit | Attending: Orthopedic Surgery | Admitting: Orthopedic Surgery

## 2022-06-18 VITALS — HR 84 | Temp 96.8°F | Resp 18 | Ht 66.0 in | Wt 190.0 lb

## 2022-06-18 DIAGNOSIS — I10 Essential (primary) hypertension: Secondary | ICD-10-CM | POA: Insufficient documentation

## 2022-06-18 DIAGNOSIS — Z01818 Encounter for other preprocedural examination: Secondary | ICD-10-CM | POA: Diagnosis not present

## 2022-06-18 DIAGNOSIS — E119 Type 2 diabetes mellitus without complications: Secondary | ICD-10-CM | POA: Insufficient documentation

## 2022-06-18 LAB — CBC
HCT: 26.9 % — ABNORMAL LOW (ref 36.0–46.0)
Hemoglobin: 9 g/dL — ABNORMAL LOW (ref 12.0–15.0)
MCH: 28.3 pg (ref 26.0–34.0)
MCHC: 33.5 g/dL (ref 30.0–36.0)
MCV: 84.6 fL (ref 80.0–100.0)
Platelets: 265 10*3/uL (ref 150–400)
RBC: 3.18 MIL/uL — ABNORMAL LOW (ref 3.87–5.11)
RDW: 13.3 % (ref 11.5–15.5)
WBC: 6.4 10*3/uL (ref 4.0–10.5)
nRBC: 0 % (ref 0.0–0.2)

## 2022-06-18 LAB — BASIC METABOLIC PANEL
Anion gap: 9 (ref 5–15)
BUN: 14 mg/dL (ref 8–23)
CO2: 26 mmol/L (ref 22–32)
Calcium: 8.8 mg/dL — ABNORMAL LOW (ref 8.9–10.3)
Chloride: 92 mmol/L — ABNORMAL LOW (ref 98–111)
Creatinine, Ser: 0.75 mg/dL (ref 0.44–1.00)
GFR, Estimated: >60 mL/min (ref >60)
Glucose, Bld: 101 mg/dL — ABNORMAL HIGH (ref 70–99)
Potassium: 3.7 mmol/L (ref 3.5–5.1)
Sodium: 127 mmol/L — ABNORMAL LOW (ref 135–145)

## 2022-06-18 NOTE — Telephone Encounter (Signed)
April Murillo sent a message stating Dorena Dew w/[employer] is needing f/u notes sending diability claim form, please follow up w/[employer] [phone]

## 2022-06-18 NOTE — Progress Notes (Signed)
Patient is scheduled for shoulder surgery on 06/22/2022.  We will need to reschedule the procedure due to taking her Trulicity on 12/11/9196.  She will need to be off the Trulicity for 7 days prior to surgery.

## 2022-06-19 LAB — HEMOGLOBIN A1C
Hgb A1c MFr Bld: 5.9 % — ABNORMAL HIGH (ref 4.8–5.6)
Mean Plasma Glucose: 122.63 mg/dL

## 2022-06-19 NOTE — Telephone Encounter (Signed)
Left VM asking what is needed, call back # given.

## 2022-06-22 ENCOUNTER — Encounter (HOSPITAL_COMMUNITY)
Admission: RE | Admit: 2022-06-22 | Discharge: 2022-06-22 | Disposition: A | Discharge status: Home / Self Care | Payer: Medicare HMO | Source: Ambulatory Visit | Attending: Orthopedic Surgery | Admitting: Orthopedic Surgery

## 2022-06-22 ENCOUNTER — Telehealth: Payer: Self-pay | Admitting: Orthopedic Surgery

## 2022-06-22 ENCOUNTER — Other Ambulatory Visit: Payer: Self-pay | Admitting: Orthopedic Surgery

## 2022-06-22 DIAGNOSIS — Z01818 Encounter for other preprocedural examination: Secondary | ICD-10-CM

## 2022-06-22 MED ORDER — SODIUM CHLORIDE 1 G PO TABS: 1.0000 g | ORAL_TABLET | Freq: Three times a day (TID) | ORAL | 0 refills | Status: AC

## 2022-06-22 NOTE — Pre-Procedure Instructions (Signed)
Patient called and will arrive at 0900 in the morning for blood work.

## 2022-06-22 NOTE — Pre-Procedure Instructions (Signed)
Dr Charna Elizabeth reviewed chart and wants patient typed and crossed. Also wants her to hold her HCTZ beginning 06/23/2022. Messaged Dr Amedeo Kinsman to let him know. Dr Charna Elizabeth wants patient placed on sodium tablet, which I also messaged Dr Amedeo Kinsman about. I will call patient to notify her to come for blood work tomorrow and order placed for Bmet repeat am of surgery.

## 2022-06-22 NOTE — Telephone Encounter (Signed)
April Murillo with [facility] returned April Murillo's call [phone].

## 2022-06-23 ENCOUNTER — Encounter (HOSPITAL_COMMUNITY)
Admission: RE | Admit: 2022-06-23 | Discharge: 2022-06-23 | Disposition: A | Discharge status: Home / Self Care | Payer: Medicare HMO | Source: Ambulatory Visit | Attending: Orthopedic Surgery | Admitting: Orthopedic Surgery

## 2022-06-23 DIAGNOSIS — Z01812 Encounter for preprocedural laboratory examination: Secondary | ICD-10-CM | POA: Insufficient documentation

## 2022-06-23 DIAGNOSIS — Z01818 Encounter for other preprocedural examination: Secondary | ICD-10-CM

## 2022-06-23 LAB — PREPARE RBC (CROSSMATCH)

## 2022-06-25 ENCOUNTER — Other Ambulatory Visit: Payer: Self-pay

## 2022-06-25 ENCOUNTER — Encounter (HOSPITAL_COMMUNITY)
Admission: RE | Disposition: A | Discharge status: Home / Self Care | Payer: Self-pay | Source: Home / Self Care | Attending: Orthopedic Surgery

## 2022-06-25 ENCOUNTER — Ambulatory Visit (HOSPITAL_COMMUNITY): Payer: Medicare HMO

## 2022-06-25 ENCOUNTER — Encounter (HOSPITAL_COMMUNITY): Payer: Self-pay | Admitting: Orthopedic Surgery

## 2022-06-25 ENCOUNTER — Observation Stay (HOSPITAL_COMMUNITY)
Admission: RE | Admit: 2022-06-25 | Discharge: 2022-06-26 | Disposition: A | Discharge status: Home / Self Care | Payer: Medicare HMO | Attending: Orthopedic Surgery | Admitting: Orthopedic Surgery

## 2022-06-25 ENCOUNTER — Ambulatory Visit (HOSPITAL_COMMUNITY): Payer: Medicare HMO | Admitting: Anesthesiology

## 2022-06-25 DIAGNOSIS — S42201A Unspecified fracture of upper end of right humerus, initial encounter for closed fracture: Secondary | ICD-10-CM | POA: Diagnosis present

## 2022-06-25 DIAGNOSIS — S42293A Other displaced fracture of upper end of unspecified humerus, initial encounter for closed fracture: Secondary | ICD-10-CM | POA: Diagnosis present

## 2022-06-25 DIAGNOSIS — R2681 Unsteadiness on feet: Secondary | ICD-10-CM | POA: Diagnosis not present

## 2022-06-25 DIAGNOSIS — S42209A Unspecified fracture of upper end of unspecified humerus, initial encounter for closed fracture: Secondary | ICD-10-CM

## 2022-06-25 DIAGNOSIS — S42241A 4-part fracture of surgical neck of right humerus, initial encounter for closed fracture: Secondary | ICD-10-CM | POA: Diagnosis not present

## 2022-06-25 DIAGNOSIS — R2689 Other abnormalities of gait and mobility: Secondary | ICD-10-CM | POA: Insufficient documentation

## 2022-06-25 DIAGNOSIS — E119 Type 2 diabetes mellitus without complications: Secondary | ICD-10-CM | POA: Insufficient documentation

## 2022-06-25 DIAGNOSIS — X58XXXA Exposure to other specified factors, initial encounter: Secondary | ICD-10-CM | POA: Insufficient documentation

## 2022-06-25 DIAGNOSIS — Z96611 Presence of right artificial shoulder joint: Secondary | ICD-10-CM | POA: Diagnosis not present

## 2022-06-25 DIAGNOSIS — I10 Essential (primary) hypertension: Secondary | ICD-10-CM | POA: Insufficient documentation

## 2022-06-25 DIAGNOSIS — Z7984 Long term (current) use of oral hypoglycemic drugs: Secondary | ICD-10-CM | POA: Diagnosis not present

## 2022-06-25 DIAGNOSIS — S42291A Other displaced fracture of upper end of right humerus, initial encounter for closed fracture: Secondary | ICD-10-CM

## 2022-06-25 DIAGNOSIS — Z01818 Encounter for other preprocedural examination: Secondary | ICD-10-CM

## 2022-06-25 DIAGNOSIS — G8918 Other acute postprocedural pain: Secondary | ICD-10-CM | POA: Diagnosis not present

## 2022-06-25 DIAGNOSIS — Z471 Aftercare following joint replacement surgery: Secondary | ICD-10-CM | POA: Diagnosis not present

## 2022-06-25 HISTORY — PX: REVERSE SHOULDER ARTHROPLASTY: SHX5054

## 2022-06-25 LAB — BASIC METABOLIC PANEL
Anion gap: 8 (ref 5–15)
BUN: 10 mg/dL (ref 8–23)
CO2: 24 mmol/L (ref 22–32)
Calcium: 9 mg/dL (ref 8.9–10.3)
Chloride: 100 mmol/L (ref 98–111)
Creatinine, Ser: 0.59 mg/dL (ref 0.44–1.00)
GFR, Estimated: >60 mL/min (ref >60)
Glucose, Bld: 84 mg/dL (ref 70–99)
Potassium: 4.1 mmol/L (ref 3.5–5.1)
Sodium: 132 mmol/L — ABNORMAL LOW (ref 135–145)

## 2022-06-25 LAB — HEMOGLOBIN AND HEMATOCRIT, BLOOD
HCT: 32.4 % — ABNORMAL LOW (ref 36.0–46.0)
Hemoglobin: 10.9 g/dL — ABNORMAL LOW (ref 12.0–15.0)

## 2022-06-25 LAB — CBC WITH DIFFERENTIAL/PLATELET
Abs Immature Granulocytes: 0.02 10*3/uL (ref 0.00–0.07)
Basophils Absolute: 0 10*3/uL (ref 0.0–0.1)
Basophils Relative: 1 %
Eosinophils Absolute: 0.1 10*3/uL (ref 0.0–0.5)
Eosinophils Relative: 1 %
HCT: 29.1 % — ABNORMAL LOW (ref 36.0–46.0)
Hemoglobin: 9.7 g/dL — ABNORMAL LOW (ref 12.0–15.0)
Immature Granulocytes: 0 %
Lymphocytes Relative: 24 %
Lymphs Abs: 1.6 10*3/uL (ref 0.7–4.0)
MCH: 28.5 pg (ref 26.0–34.0)
MCHC: 33.3 g/dL (ref 30.0–36.0)
MCV: 85.6 fL (ref 80.0–100.0)
Monocytes Absolute: 0.5 10*3/uL (ref 0.1–1.0)
Monocytes Relative: 8 %
Neutro Abs: 4.4 10*3/uL (ref 1.7–7.7)
Neutrophils Relative %: 66 %
Platelets: 316 10*3/uL (ref 150–400)
RBC: 3.4 MIL/uL — ABNORMAL LOW (ref 3.87–5.11)
RDW: 13.7 % (ref 11.5–15.5)
WBC: 6.6 10*3/uL (ref 4.0–10.5)
nRBC: 0 % (ref 0.0–0.2)

## 2022-06-25 LAB — ABO/RH: ABO/RH(D): A POS

## 2022-06-25 LAB — GLUCOSE, CAPILLARY: Glucose-Capillary: 145 mg/dL — ABNORMAL HIGH (ref 70–99)

## 2022-06-25 SURGERY — ARTHROPLASTY, SHOULDER, TOTAL, REVERSE
Anesthesia: General | Site: Shoulder | Laterality: Right

## 2022-06-25 MED ORDER — DOCUSATE SODIUM 100 MG PO CAPS
100.0000 mg | ORAL_CAPSULE | Freq: Two times a day (BID) | ORAL | Status: DC
  Administered 2022-06-25 – 2022-06-26 (×2): 100 mg via ORAL
  Filled 2022-06-25 (×2): qty 1

## 2022-06-25 MED ORDER — ORAL CARE MOUTH RINSE: 15.0000 mL | Freq: Once | OROMUCOSAL | Status: AC

## 2022-06-25 MED ORDER — ONDANSETRON HCL 4 MG/2ML IJ SOLN: 4.0000 mg | Freq: Four times a day (QID) | INTRAMUSCULAR | Status: DC | PRN

## 2022-06-25 MED ORDER — VANCOMYCIN HCL 1000 MG IV SOLR
INTRAVENOUS | Status: DC | PRN
  Administered 2022-06-25: 1000 mg

## 2022-06-25 MED ORDER — SIMVASTATIN 20 MG PO TABS
10.0000 mg | ORAL_TABLET | Freq: Every day | ORAL | Status: DC
  Administered 2022-06-25: 10 mg via ORAL
  Filled 2022-06-25: qty 1

## 2022-06-25 MED ORDER — ONDANSETRON HCL 4 MG PO TABS: 4.0000 mg | ORAL_TABLET | Freq: Four times a day (QID) | ORAL | Status: DC | PRN

## 2022-06-25 MED ORDER — MIDAZOLAM HCL 2 MG/2ML IJ SOLN
INTRAMUSCULAR | Status: AC
  Administered 2022-06-25: 2 mg
  Filled 2022-06-25: qty 2

## 2022-06-25 MED ORDER — ONDANSETRON HCL 4 MG/2ML IJ SOLN: 4.0000 mg | Freq: Once | INTRAMUSCULAR | Status: DC | PRN

## 2022-06-25 MED ORDER — HYDROCODONE-ACETAMINOPHEN 5-325 MG PO TABS
1.0000 | ORAL_TABLET | ORAL | Status: DC | PRN
  Administered 2022-06-26: 1 via ORAL
  Filled 2022-06-25: qty 1

## 2022-06-25 MED ORDER — SODIUM CHLORIDE 0.9 % IR SOLN
Status: DC | PRN
  Administered 2022-06-25: 1000 mL
  Administered 2022-06-25: 3000 mL

## 2022-06-25 MED ORDER — PHENYLEPHRINE HCL-NACL 20-0.9 MG/250ML-% IV SOLN
INTRAVENOUS | Status: DC | PRN
  Administered 2022-06-25: 20 ug/min via INTRAVENOUS

## 2022-06-25 MED ORDER — PANTOPRAZOLE SODIUM 40 MG PO TBEC
40.0000 mg | DELAYED_RELEASE_TABLET | Freq: Every day | ORAL | Status: DC
  Administered 2022-06-26: 40 mg via ORAL
  Filled 2022-06-25: qty 1

## 2022-06-25 MED ORDER — CELECOXIB 100 MG PO CAPS
100.0000 mg | ORAL_CAPSULE | Freq: Every day | ORAL | Status: DC
  Administered 2022-06-26: 100 mg via ORAL
  Filled 2022-06-25: qty 1

## 2022-06-25 MED ORDER — GABAPENTIN 300 MG PO CAPS
300.0000 mg | ORAL_CAPSULE | Freq: Every day | ORAL | Status: DC
  Administered 2022-06-25: 300 mg via ORAL
  Filled 2022-06-25: qty 1

## 2022-06-25 MED ORDER — ACETAMINOPHEN 325 MG PO TABS
325.0000 mg | ORAL_TABLET | Freq: Four times a day (QID) | ORAL | Status: DC | PRN
  Administered 2022-06-25: 650 mg via ORAL
  Filled 2022-06-25: qty 2

## 2022-06-25 MED ORDER — STERILE WATER FOR IRRIGATION IR SOLN
Status: DC | PRN
  Administered 2022-06-25: 500 mL

## 2022-06-25 MED ORDER — MIDAZOLAM HCL 5 MG/5ML IJ SOLN
INTRAMUSCULAR | Status: DC | PRN
  Administered 2022-06-25: 1 mg via INTRAVENOUS

## 2022-06-25 MED ORDER — SODIUM CHLORIDE 0.9 % IV SOLN: INTRAVENOUS | Status: DC | PRN

## 2022-06-25 MED ORDER — VANCOMYCIN HCL 1000 MG IV SOLR
INTRAVENOUS | Status: AC
  Filled 2022-06-25: qty 20

## 2022-06-25 MED ORDER — LIDOCAINE HCL (PF) 1 % IJ SOLN
INTRAMUSCULAR | Status: AC
  Filled 2022-06-25: qty 30

## 2022-06-25 MED ORDER — MORPHINE SULFATE (PF) 2 MG/ML IV SOLN
0.5000 mg | INTRAVENOUS | Status: DC | PRN
  Administered 2022-06-26: 1 mg via INTRAVENOUS
  Filled 2022-06-25 (×2): qty 1

## 2022-06-25 MED ORDER — FENTANYL CITRATE (PF) 250 MCG/5ML IJ SOLN
INTRAMUSCULAR | Status: DC | PRN
  Administered 2022-06-25: 50 ug via INTRAVENOUS

## 2022-06-25 MED ORDER — ONDANSETRON HCL 4 MG/2ML IJ SOLN
INTRAMUSCULAR | Status: AC
  Filled 2022-06-25: qty 2

## 2022-06-25 MED ORDER — TRANEXAMIC ACID-NACL 1000-0.7 MG/100ML-% IV SOLN
1000.0000 mg | INTRAVENOUS | Status: AC
  Administered 2022-06-25: 1000 mg via INTRAVENOUS

## 2022-06-25 MED ORDER — SUCCINYLCHOLINE CHLORIDE 200 MG/10ML IV SOSY
PREFILLED_SYRINGE | INTRAVENOUS | Status: AC
  Filled 2022-06-25: qty 10

## 2022-06-25 MED ORDER — AMLODIPINE BESYLATE 5 MG PO TABS
5.0000 mg | ORAL_TABLET | Freq: Every day | ORAL | Status: DC
  Administered 2022-06-26: 5 mg via ORAL
  Filled 2022-06-25: qty 1

## 2022-06-25 MED ORDER — FENTANYL CITRATE (PF) 250 MCG/5ML IJ SOLN
INTRAMUSCULAR | Status: AC
  Filled 2022-06-25: qty 5

## 2022-06-25 MED ORDER — DIPHENHYDRAMINE HCL 12.5 MG/5ML PO ELIX: 12.5000 mg | ORAL_SOLUTION | ORAL | Status: DC | PRN

## 2022-06-25 MED ORDER — TRANEXAMIC ACID-NACL 1000-0.7 MG/100ML-% IV SOLN
INTRAVENOUS | Status: AC
  Filled 2022-06-25: qty 100

## 2022-06-25 MED ORDER — HYDROCHLOROTHIAZIDE 25 MG PO TABS
25.0000 mg | ORAL_TABLET | Freq: Every day | ORAL | Status: DC
  Administered 2022-06-26: 25 mg via ORAL
  Filled 2022-06-25: qty 1

## 2022-06-25 MED ORDER — BUPIVACAINE LIPOSOME 1.3 % IJ SUSP
INTRAMUSCULAR | Status: DC | PRN
  Administered 2022-06-25: 10 mL via PERINEURAL

## 2022-06-25 MED ORDER — CEFAZOLIN SODIUM-DEXTROSE 2-4 GM/100ML-% IV SOLN
2.0000 g | INTRAVENOUS | Status: AC
  Administered 2022-06-25: 2 g via INTRAVENOUS

## 2022-06-25 MED ORDER — LIDOCAINE HCL (PF) 1 % IJ SOLN
INTRAMUSCULAR | Status: DC | PRN
  Administered 2022-06-25: 3 mL

## 2022-06-25 MED ORDER — LACTATED RINGERS IV SOLN: INTRAVENOUS | Status: DC

## 2022-06-25 MED ORDER — BUPIVACAINE-EPINEPHRINE (PF) 0.5% -1:200000 IJ SOLN
INTRAMUSCULAR | Status: AC
  Filled 2022-06-25: qty 30

## 2022-06-25 MED ORDER — PROPOFOL 10 MG/ML IV BOLUS
INTRAVENOUS | Status: AC
  Filled 2022-06-25: qty 20

## 2022-06-25 MED ORDER — ROCURONIUM BROMIDE 10 MG/ML (PF) SYRINGE
PREFILLED_SYRINGE | INTRAVENOUS | Status: AC
  Filled 2022-06-25: qty 10

## 2022-06-25 MED ORDER — BUPIVACAINE HCL (PF) 0.5 % IJ SOLN
INTRAMUSCULAR | Status: AC
  Filled 2022-06-25: qty 30

## 2022-06-25 MED ORDER — BUPIVACAINE LIPOSOME 1.3 % IJ SUSP
INTRAMUSCULAR | Status: AC
  Filled 2022-06-25: qty 10

## 2022-06-25 MED ORDER — ROCURONIUM BROMIDE 100 MG/10ML IV SOLN
INTRAVENOUS | Status: DC | PRN
  Administered 2022-06-25: 60 mg via INTRAVENOUS
  Administered 2022-06-25 (×3): 10 mg via INTRAVENOUS

## 2022-06-25 MED ORDER — HYDROMORPHONE HCL 1 MG/ML IJ SOLN: 0.2500 mg | INTRAMUSCULAR | Status: DC | PRN

## 2022-06-25 MED ORDER — PHENYLEPHRINE 80 MCG/ML (10ML) SYRINGE FOR IV PUSH (FOR BLOOD PRESSURE SUPPORT)
PREFILLED_SYRINGE | INTRAVENOUS | Status: DC | PRN
  Administered 2022-06-25 (×3): 160 ug via INTRAVENOUS

## 2022-06-25 MED ORDER — POLYETHYLENE GLYCOL 3350 17 G PO PACK
17.0000 g | PACK | Freq: Every day | ORAL | Status: DC | PRN
  Administered 2022-06-25: 17 g via ORAL
  Filled 2022-06-25: qty 1

## 2022-06-25 MED ORDER — CEFAZOLIN SODIUM-DEXTROSE 2-4 GM/100ML-% IV SOLN
2.0000 g | Freq: Three times a day (TID) | INTRAVENOUS | Status: AC
  Administered 2022-06-25 – 2022-06-26 (×2): 2 g via INTRAVENOUS
  Filled 2022-06-25 (×2): qty 100

## 2022-06-25 MED ORDER — INFLUENZA VAC A&B SA ADJ QUAD 0.5 ML IM PRSY: 0.5000 mL | PREFILLED_SYRINGE | INTRAMUSCULAR | Status: DC

## 2022-06-25 MED ORDER — ONDANSETRON HCL 4 MG PO TABS: 4.0000 mg | ORAL_TABLET | Freq: Three times a day (TID) | ORAL | Status: DC | PRN

## 2022-06-25 MED ORDER — ONDANSETRON HCL 4 MG/2ML IJ SOLN
INTRAMUSCULAR | Status: DC | PRN
  Administered 2022-06-25: 4 mg via INTRAVENOUS

## 2022-06-25 MED ORDER — DEXAMETHASONE SODIUM PHOSPHATE 10 MG/ML IJ SOLN
INTRAMUSCULAR | Status: AC
  Filled 2022-06-25: qty 1

## 2022-06-25 MED ORDER — MIDAZOLAM HCL 2 MG/2ML IJ SOLN
INTRAMUSCULAR | Status: AC
  Filled 2022-06-25: qty 2

## 2022-06-25 MED ORDER — CEFAZOLIN SODIUM-DEXTROSE 2-4 GM/100ML-% IV SOLN
INTRAVENOUS | Status: AC
  Filled 2022-06-25: qty 100

## 2022-06-25 MED ORDER — BUPIVACAINE HCL (PF) 0.5 % IJ SOLN
INTRAMUSCULAR | Status: DC | PRN
  Administered 2022-06-25: 10 mL

## 2022-06-25 MED ORDER — PROPOFOL 10 MG/ML IV BOLUS
INTRAVENOUS | Status: DC | PRN
  Administered 2022-06-25: 150 mg via INTRAVENOUS

## 2022-06-25 MED ORDER — METFORMIN HCL 500 MG PO TABS
1000.0000 mg | ORAL_TABLET | Freq: Two times a day (BID) | ORAL | Status: DC
  Administered 2022-06-25 – 2022-06-26 (×2): 1000 mg via ORAL
  Filled 2022-06-25 (×2): qty 2

## 2022-06-25 MED ORDER — DEXAMETHASONE SODIUM PHOSPHATE 10 MG/ML IJ SOLN
INTRAMUSCULAR | Status: DC | PRN
  Administered 2022-06-25: 10 mg via INTRAVENOUS

## 2022-06-25 MED ORDER — CHLORHEXIDINE GLUCONATE 0.12 % MT SOLN
15.0000 mL | Freq: Once | OROMUCOSAL | Status: AC
  Administered 2022-06-25: 15 mL via OROMUCOSAL

## 2022-06-25 MED ORDER — HYDROCODONE-ACETAMINOPHEN 7.5-325 MG PO TABS: 1.0000 | ORAL_TABLET | ORAL | Status: DC | PRN

## 2022-06-25 MED ORDER — FENTANYL CITRATE PF 50 MCG/ML IJ SOSY
PREFILLED_SYRINGE | INTRAMUSCULAR | Status: AC
  Administered 2022-06-25: 50 ug
  Filled 2022-06-25: qty 1

## 2022-06-25 MED ORDER — LIDOCAINE HCL (PF) 2 % IJ SOLN
INTRAMUSCULAR | Status: AC
  Filled 2022-06-25: qty 5

## 2022-06-25 MED ORDER — SUGAMMADEX SODIUM 200 MG/2ML IV SOLN
INTRAVENOUS | Status: DC | PRN
  Administered 2022-06-25: 200 mg via INTRAVENOUS

## 2022-06-25 MED ORDER — LOSARTAN POTASSIUM 50 MG PO TABS
100.0000 mg | ORAL_TABLET | Freq: Every day | ORAL | Status: DC
  Administered 2022-06-26: 100 mg via ORAL
  Filled 2022-06-25: qty 2

## 2022-06-25 SURGICAL SUPPLY — 80 items
APL PRP STRL LF DISP 70% ISPRP (MISCELLANEOUS) ×1
APL SKNCLS STERI-STRIP NONHPOA (GAUZE/BANDAGES/DRESSINGS) ×1
BENZOIN TINCTURE PRP APPL 2/3 (GAUZE/BANDAGES/DRESSINGS) IMPLANT
BIT DRILL FLUTED 3.0 STRL (BIT) IMPLANT
BLADE SAW SGTL 83.5X18.5 (BLADE) ×1 IMPLANT
BNDG GAUZE ELAST 4 BULKY (GAUZE/BANDAGES/DRESSINGS) ×1 IMPLANT
BSPLAT GLND +2X24 MDLR (Joint) ×1 IMPLANT
CHLORAPREP W/TINT 26 (MISCELLANEOUS) ×2 IMPLANT
CLOTH BEACON ORANGE TIMEOUT ST (SAFETY) ×1 IMPLANT
COOLER ICEMAN CLASSIC (MISCELLANEOUS) ×1 IMPLANT
COVER LIGHT HANDLE STERIS (MISCELLANEOUS) ×2 IMPLANT
CUP SUT UNIV REVERS 36+2 RT (Cup) IMPLANT
DRAPE HALF SHEET 40X57 (DRAPES) ×1 IMPLANT
DRAPE SHOULDER BEACH CHAIR (DRAPES) ×1 IMPLANT
DRAPE U-SHAPE 47X51 STRL (DRAPES) ×1 IMPLANT
DRSG AQUACEL AG ADV 3.5X10 (GAUZE/BANDAGES/DRESSINGS) IMPLANT
DRSG PAD ABDOMINAL 8X10 ST (GAUZE/BANDAGES/DRESSINGS) ×2 IMPLANT
ELECT REM PT RETURN 9FT ADLT (ELECTROSURGICAL) ×1
ELECTRODE REM PT RTRN 9FT ADLT (ELECTROSURGICAL) ×1 IMPLANT
FIBERTAPE CERCLAGE TLINK SUT (SUTURE) IMPLANT
GLENOID UNI REV MOD 24 +2 LAT (Joint) IMPLANT
GLENOSPHERE 36 +4 LAT/24 (Joint) IMPLANT
GLOVE BIO SURGEON STRL SZ7 (GLOVE) IMPLANT
GLOVE BIO SURGEON STRL SZ8 (GLOVE) ×1 IMPLANT
GLOVE BIOGEL PI IND STRL 7.0 (GLOVE) ×2 IMPLANT
GLOVE ECLIPSE 6.5 STRL STRAW (GLOVE) IMPLANT
GLOVE SRG 8 PF TXTR STRL LF DI (GLOVE) ×1 IMPLANT
GLOVE SURG UNDER POLY LF SZ8 (GLOVE) ×1
GOWN STRL REUS W/ TWL XL LVL3 (GOWN DISPOSABLE) ×1 IMPLANT
GOWN STRL REUS W/TWL LRG LVL3 (GOWN DISPOSABLE) ×2 IMPLANT
GOWN STRL REUS W/TWL XL LVL3 (GOWN DISPOSABLE) ×1
HANDPIECE INTERPULSE COAX TIP (DISPOSABLE) ×1
HOOD W/PEELAWAY (MISCELLANEOUS) ×3 IMPLANT
INSERT HUMERAL 36 +6 (Shoulder) IMPLANT
INST SET MINOR BONE (KITS) ×1 IMPLANT
IV NS IRRIG 3000ML ARTHROMATIC (IV SOLUTION) ×1 IMPLANT
KIT BLADEGUARD II DBL (SET/KITS/TRAYS/PACK) IMPLANT
KIT POSITION SHOULDER SCHLEI (MISCELLANEOUS) ×1 IMPLANT
KIT STABILIZATION SHOULDER (MISCELLANEOUS) ×1 IMPLANT
KIT TURNOVER KIT A (KITS) ×1 IMPLANT
MANIFOLD NEPTUNE II (INSTRUMENTS) ×1 IMPLANT
MARKER SKIN DUAL TIP RULER LAB (MISCELLANEOUS) ×1 IMPLANT
NDL HYPO 21X1.5 SAFETY (NEEDLE) IMPLANT
NDL MAYO 1/2 CRC TROCAR PT (NEEDLE) IMPLANT
NEEDLE HYPO 21X1.5 SAFETY (NEEDLE) IMPLANT
NEEDLE MAYO 1/2 CRC TROCAR PT (NEEDLE) ×1 IMPLANT
NS IRRIG 1000ML POUR BTL (IV SOLUTION) ×1 IMPLANT
PACK BASIC III (CUSTOM PROCEDURE TRAY) ×1
PACK SRG BSC III STRL LF ECLPS (CUSTOM PROCEDURE TRAY) IMPLANT
PACK TOTAL JOINT (CUSTOM PROCEDURE TRAY) ×1 IMPLANT
PAD ABD 5X9 TENDERSORB (GAUZE/BANDAGES/DRESSINGS) IMPLANT
PAD ABD 8X10 STRL (GAUZE/BANDAGES/DRESSINGS) IMPLANT
PAD COLD SHLDR SM WRAP-ON (PAD) IMPLANT
PASSER CERCLAGE STRT MED DISP (ORTHOPEDIC DISPOSABLE SUPPLIES) IMPLANT
PIN NITINOL TARGETER 2.8 (PIN) IMPLANT
PIN SET MODULAR GLENOID SYSTEM (PIN) IMPLANT
SCREW CENTRAL MODULAR 25 (Screw) IMPLANT
SCREW PERI LOCK 5.5X16 (Screw) IMPLANT
SCREW PERI LOCK 5.5X24 (Screw) IMPLANT
SCREW PERI LOCK 5.5X32 (Screw) IMPLANT
SET BASIN LINEN APH (SET/KITS/TRAYS/PACK) ×1 IMPLANT
SET HNDPC FAN SPRY TIP SCT (DISPOSABLE) IMPLANT
SLING ARM IMMOBILIZER LRG (SOFTGOODS) IMPLANT
STEM HUMERAL UNI REVERS SZ9 (Stem) IMPLANT
STRIP CLOSURE SKIN 1/2X4 (GAUZE/BANDAGES/DRESSINGS) ×2 IMPLANT
SUT MNCRL AB 4-0 PS2 18 (SUTURE) ×1 IMPLANT
SUT MON AB 2-0 CT1 36 (SUTURE) ×1 IMPLANT
SUT VIC AB 0 CT1 27 (SUTURE) ×1
SUT VIC AB 0 CT1 27XBRD ANTBC (SUTURE) ×1 IMPLANT
SUTURE TAPE 1.3 40 TPR END (SUTURE) ×2 IMPLANT
SUTURETAPE 1.3 40 TPR END (SUTURE) ×4
SUTURETAPE 1.3 40 W/NDL BLK/WH (SUTURE) ×2 IMPLANT
SUTURETAPE 1.3 40 W/NDL BLUE (SUTURE) IMPLANT
SYR 30ML LL (SYRINGE) IMPLANT
SYR BULB IRRIG 60ML STRL (SYRINGE) ×1 IMPLANT
TENSIONER FIBERTAPE CERCLAGE (DISPOSABLE) IMPLANT
TRAY FOLEY W/BAG SLVR 16FR (SET/KITS/TRAYS/PACK) ×1
TRAY FOLEY W/BAG SLVR 16FR ST (SET/KITS/TRAYS/PACK) ×1 IMPLANT
WATER STERILE IRR 1000ML POUR (IV SOLUTION) ×1 IMPLANT
YANKAUER SUCT 12FT TUBE ARGYLE (SUCTIONS) ×1 IMPLANT

## 2022-06-25 NOTE — Anesthesia Procedure Notes (Signed)
Procedure Name: Intubation Date/Time: 06/25/2022 8:09 AM  Performed by: Hewitt Blade, CRNAPre-anesthesia Checklist: Patient identified, Emergency Drugs available, Suction available and Patient being monitored Patient Re-evaluated:Patient Re-evaluated prior to induction Oxygen Delivery Method: Circle system utilized Preoxygenation: Pre-oxygenation with 100% oxygen Induction Type: IV induction Ventilation: Mask ventilation without difficulty Laryngoscope Size: Mac and 3 Grade View: Grade I Tube type: Oral Tube size: 7.0 mm Number of attempts: 1 Airway Equipment and Method: Stylet Placement Confirmation: ETT inserted through vocal cords under direct vision, positive ETCO2 and breath sounds checked- equal and bilateral Secured at: 21 cm Tube secured with: Tape Dental Injury: Teeth and Oropharynx as per pre-operative assessment

## 2022-06-25 NOTE — Transfer of Care (Signed)
Immediate Anesthesia Transfer of Care Note  Patient: MAZZY SANTARELLI  Procedure(s) Performed: REVERSE SHOULDER ARTHROPLASTY (Right: Shoulder)  Patient Location: PACU  Anesthesia Type:General  Level of Consciousness: awake, alert , and oriented  Airway & Oxygen Therapy: Patient Spontanous Breathing  Post-op Assessment: Report given to RN and Post -op Vital signs reviewed and stable  Post vital signs: Reviewed and stable  Last Vitals:  Vitals Value Taken Time  BP 126/63 06/25/22 1230  Temp 36.4 C 06/25/22 1202  Pulse 86 06/25/22 1239  Resp 19 06/25/22 1239  SpO2 95 % 06/25/22 1239  Vitals shown include unvalidated device data.  Last Pain:  Vitals:   06/25/22 1229  TempSrc:   PainSc: 0-No pain      Patients Stated Pain Goal: 6 (54/65/03 [id number])  Complications: No notable events documented.

## 2022-06-25 NOTE — Op Note (Signed)
Orthopaedic Surgery Operative Note (CSN: [id number])  April Murillo  [birth year] Date of Surgery: 06/25/2022   Diagnoses:  Right 4 part proximal humerus fracture  Procedure: Right Reverse Shoulder Arthroplasty for a fracture    Operative Finding Successful completion of the planned procedure.  Size 9 stem with 6 mm poly and good fixation of the tuberosities.  36 mm glenosphere with 24 mm baseplate, 3 non-locking screws.   She received 2 U PRBC during surgery, based on intraoperative blood loss and her preoperative hemoglobin.   Post-Op Diagnosis: Same Surgeons:Primary: Mordecai Rasmussen, MD Assistants:  Cecilio Asper Location: [facility] OR ROOM 4 Anesthesia: General with regional anesthesia Antibiotics: Ancef 2 g with local vancomycin powder 1 g at the surgical site Tourniquet time: N/A Estimated Blood Loss: 784 cc Complications: None Specimens: None Implants: Implant Name Type Inv. Item Serial No. Manufacturer Lot No. LRB No. Used Action  SCREW CENTRAL MODULAR 25 - [id number] Screw SCREW CENTRAL MODULAR 25  [employer] [id number] Right 1 Implanted  GLENOID UNI REV MOD 24 +2 LAT - [id number] Joint GLENOID UNI REV MOD 24 +2 LAT  [employer] [id number] Right 1 Implanted  SCREW PERI LOCK 5.5X32 - [id number] Screw SCREW PERI LOCK 5.5X32  [employer] [id number] Right 1 Implanted  SCREW PERI LOCK 5.5X24 - [id number] Screw SCREW PERI LOCK 5.5X24  [employer] [id number] Right 1 Implanted  SCREW PERI LOCK 5.5X16 - [id number] Screw SCREW PERI LOCK 5.5X16  [employer] [id number] Right 1 Implanted  GLENOSPHERE 36 +4 LAT/24 - [id number] Joint GLENOSPHERE 36 +4 LAT/24  [employer] [id number] Right 1 Implanted  STEM HUMERAL UNI REVERS SZ9 - [id number] Stem STEM HUMERAL UNI REVERS SZ9  [employer] [id number] Right 1 Implanted  INSERT HUMERAL 36 +6 - [id number] Shoulder INSERT HUMERAL 36 +6  [employer] [id number] Right 1 Implanted  CUP SUT UNIV REVERS 36+2 RT - [id number] Cup CUP SUT UNIV REVERS 36+2 RT  [place] [id number]  Right 1 Implanted    Indications for Surgery:   April Murillo is a 72 y.o. female who fell and sustained a right 4-part proximal humerus fracture with humeral head dislocation.  Given her level of function, I have recommended surgery, to include reverse shoulder arthroplasty.  Benefits and risks of operative and nonoperative management were discussed prior to surgery with the patient and informed consent form was completed.  Specific risks including infection, need for additional surgery, bleeding, non-union, malunion, stiffness, persistent pain, loss of motion, dislocation and more severe complications associated with anesthesia.  All questions were answered.  She elected to proceed.  Surgical consent was finalized.    Procedure:   The patient was identified properly. Informed consent was obtained and the surgical site was marked. The patient was taken to the OR where general anesthesia was induced.  The patient was positioned in a beach chair.  The right arm was prepped and draped in the usual sterile fashion.  Timeout was performed before the beginning of the case.  She received Ancef and TXA prior to making incision.   We used a standard deltopectoral approach with a #10 blade. We dissected down to the subcutaneous tissues and the cephalic vein was taken laterally with the deltoid. Clavipectoral fascia was incised in line with the incision. Deep retractors were placed. The long head of the biceps tendon was identified and there was significant tenosynovitis present.  Tenodesis was performed to the pectoralis tendon with a fiberwire. The remaining biceps was followed up into the rotator interval  where it was released.    We used the bicipital groove as a landmark for the lesser and greater tuberosity fragments.  We were able to mobilize the lesser tuberosity fragment and placed stay sutures in the bone tendon junction to help with mobilization.  The lesser tuberosity was fragmented, with multiple  smaller pieces.  These fragments were debrided to a shell of bone.  We then freed up the humeral head fragment, which was dislocated anteriorly.  The capsule and scar tissue was intact to the inferior aspect of the humeral head.  Once we were able to excise this, we were able to remove the humeral head fragment.  At this point we were able to identify the  greater tuberosity fragment.  It was also in multiple small pieces.  The most anterior aspect of the greater tuberosity was not attached to the rotator cuff tendons.  This was excised.  We were then able to place a #5  FiberWire in the remaining tuberosity and the bone-tendon junction for control. Once these were both mobilized we took care to identify the shaft fragment.  Fracture callus was debrided. At this point the axillary nerve was found and palpated and with a tug test noted to be intact.  It was protected throughout the remainder of the case with blunt retractors.    We then released the SGHL with bovie cautery prior to placing a curved mayo at the junction of the anterior glenoid well above the axillary nerve and bluntly dissecting the subscapularis from the capsule.  We then carefully protected the axillary nerve as we gently released the inferior capsule to fully mobilize the subscapularis.  An anterior deltoid retractor was then placed as well as a small Hohmann retractor superiorly.   The glenoid was relatively preserved as we would expect in this fracture patient.  There was a small fracture at the anterior rim of the glenoid.  The remaining labrum was removed circumferentially taking great care not to disrupt the posterior capsule.    The glenoid drill guide was placed and used to drill a guide pin using the preoperative template guide. The glenoid face was then reamed concentrically over the guide wire. The center hole was drilled over the guidepin in a near anatomic angle of version. Next the glenoid vault was drilled back to a depth of 25  mm.  We tapped and then placed a 24 mm size baseplate with 2 mm lateralization was selected with a 25 mm length central screw.  The base plate was screwed into the glenoid vault obtaining secure fixation. We next placed superior and inferior locking screws for additional fixation, followed by a posterior screw.  Next a 36 + 4 mm glenosphere was selected and impacted onto the baseplate. The center screw was tightened.   We then repositioned the arm to give access to the humeral shaft fragment.  The calcar was in roughly the appropriate position for a head cut, but the angle was off.  At this point we placed 2 cerclage tapes to maintain hoop stresses during reaming.  We broached starting with a size 5 broach and broaching up to 9 which obtained an appropriate fit above the pec and was solidly fixed.   We trialed with multiple size tray and polyethylene options and selected a posterior offset tray and a 6 mm poly, which provided good stability and range of motion without excess soft tissue tension. The offset was dialed in to match the normal anatomy. The shoulder was trialed.  There was good ROM in all planes and the shoulder was stable with no inferior translation.   We then mobilized her tuberosities again and placed the anterior deep limbs of the 4 #5 fiber wires using the holes in the tray. A 36 mm tray was selected and impacted onto the stem.   A 36+6 polyethylene liner was impacted onto the stem.  The joint was reduced and thoroughly irrigated with pulsatile lavage. The remaining sutures were then placed through the subscapularis and the bone tendon junction and the tuberosities were reduced after bone graft placed beneath as autograft at the subscap.  We horizontally secured the tuberosities before placing vertical fixation using the tails of the cerclage tape that was placed on the shaft.  Tuberosities moved as a unit were happy with the overall reduction.  We irrigated copiously at this point.   Hemostasis was obtained.   1 g of vancomycin powder was placed in the incision.  The deltopectoral interval was reapproximated with 2-0 vicryl. The subcutaneous tissues were closed with  2-0 monocryl and the skin was closed with running monocryl.     The wounds were cleaned and dried and an Aquacel dressing was placed. The drapes taken down. The arm was placed into sling with abduction pillow. Patient was awakened, extubated, and transferred to the recovery room in stable condition. There were no intraoperative complications. The sponge, needle, and attention counts were correct at the end of the case.   Post-operative plan:  The patient will be admitted for over night observation.   PT/OT evalutaion prior to DC Sling at all times for 4 weeks DVT prophylaxis Aspirin 81 mg twice daily for 6 weeks.    Pain control with PRN pain medication preferring oral medicines.   Follow up plan will be scheduled in approximately 14 days for incision check and XR.

## 2022-06-25 NOTE — Anesthesia Preprocedure Evaluation (Addendum)
Anesthesia Evaluation  Patient identified by MRN, date of birth, ID band Patient awake    Reviewed: Allergy & Precautions, H&P , NPO status , Patient's Chart, lab work & pertinent test results  Airway Mallampati: II  TM Distance: >3 FB Neck ROM: Full    Dental  (+) Dental Advisory Given, Partial Lower, Partial Upper   Pulmonary neg pulmonary ROS   Pulmonary exam normal breath sounds clear to auscultation       Cardiovascular hypertension, Pt. on medications  Rhythm:Regular Rate:Normal + Systolic murmurs    Neuro/Psych negative neurological ROS  negative psych ROS   GI/Hepatic Neg liver ROS,GERD  Medicated and Controlled,,  Endo/Other  diabetes, Well Controlled, Type 2, Oral Hypoglycemic Agents    Renal/GU negative Renal ROS  negative genitourinary   Musculoskeletal  (+) Arthritis , Osteoarthritis,    Abdominal   Peds negative pediatric ROS (+)  Hematology  (+) Blood dyscrasia, anemia   Anesthesia Other Findings Hyponatremia   Reproductive/Obstetrics negative OB ROS                             Anesthesia Physical Anesthesia Plan  ASA: 2  Anesthesia Plan: General   Post-op Pain Management: Regional block* and Dilaudid IV   Induction:   PONV Risk Score and Plan: 4 or greater and Ondansetron and Dexamethasone  Airway Management Planned: Oral ETT  Additional Equipment:   Intra-op Plan:   Post-operative Plan: Extubation in OR  Informed Consent: I have reviewed the patients History and Physical, chart, labs and discussed the procedure including the risks, benefits and alternatives for the proposed anesthesia with the patient or authorized representative who has indicated his/her understanding and acceptance.     Dental advisory given  Plan Discussed with: CRNA and Surgeon  Anesthesia Plan Comments:        Anesthesia Quick Evaluation

## 2022-06-25 NOTE — Anesthesia Procedure Notes (Signed)
Anesthesia Regional Block: Interscalene brachial plexus block   Pre-Anesthetic Checklist: , timeout performed,  Correct Patient, Correct Site, Correct Laterality,  Correct Procedure, Correct Position, site marked,  Risks and benefits discussed,  At surgeon's request and post-op pain management  Laterality: Upper and Right  Prep: chloraprep       Needles:  Injection technique: Single-shot  Needle Type: Echogenic Stimulator Needle     Needle Length: 8.3cm  Needle Gauge: 22     Additional Needles:   Procedures:,,,, ultrasound used (permanent image in chart),,    Narrative:  Start time: 06/25/2022 7:30 AM End time: 06/25/2022 7:36 AM Injection made incrementally with aspirations every 5 mL.  Performed by: Personally  Anesthesiologist: Denese Killings, MD  Additional Notes: Block assessed prior to start of surgery

## 2022-06-25 NOTE — Anesthesia Postprocedure Evaluation (Signed)
Anesthesia Post Note  Patient: April Murillo  Procedure(s) Performed: REVERSE SHOULDER ARTHROPLASTY (Right: Shoulder)  Patient location during evaluation: PACU Anesthesia Type: General Level of consciousness: awake and alert and oriented Pain management: pain level controlled Vital Signs Assessment: post-procedure vital signs reviewed and stable Respiratory status: spontaneous breathing, nonlabored ventilation and respiratory function stable Cardiovascular status: blood pressure returned to baseline and stable Postop Assessment: no apparent nausea or vomiting Anesthetic complications: no  No notable events documented.   Last Vitals:  Vitals:   06/25/22 1229 06/25/22 1230  BP:    Pulse: 87   Resp: 18   Temp:    SpO2: 96% 96%    Last Pain:  Vitals:   06/25/22 1229  TempSrc:   PainSc: 0-No pain                 Jaquayla Hege C Nema Oatley

## 2022-06-25 NOTE — Interval H&P Note (Signed)
History and Physical Interval Note:  06/25/2022 7:20 AM  April Murillo  has presented today for surgery, with the diagnosis of Right 4 part proximal humerus fracture.  The various methods of treatment have been discussed with the patient and family. After consideration of risks, benefits and other options for treatment, the patient has consented to  Procedure(s): REVERSE SHOULDER ARTHROPLASTY (Right) as a surgical intervention.  The patient's history has been reviewed, patient examined, no change in status, stable for surgery.  I have reviewed the patient's chart and labs.  Questions were answered to the patient's satisfaction.    Hemoglobin 9.7 today, Sodium improved to 132.  Will monitor Hb closely, may provide 1 U PRBC intraoperatively.  Discussed with the patient, she is aware.    Mordecai Rasmussen

## 2022-06-25 NOTE — Discharge Instructions (Addendum)
April Murillo A. Amedeo Kinsman, MD [facility] [address] [place],  [place]  [address] Phone: [phone] Fax: [phone]    Whigham may leave the operative dressing in place until your follow-up appointment. KEEP THE INCISIONS CLEAN AND DRY. There may be a small amount of fluid/bleeding leaking at the surgical site. This is normal after surgery.  If it fills with liquid or blood please call us immediately to change it for you. Use the provided ice machine or Ice packs as often as possible for the first 3-4 days, then as needed for pain relief.  Keep a layer of cloth or a shirt between your skin and the cooling unit to prevent frost bite as it can get very cold.  SHOWERING: - You may shower on Post-Op Day #2.  - The dressing is water resistant but do not scrub it as it may start to peel up.   - You may remove the sling for showering, but keep a water resistant pillow under the arm to keep both the  elbow and shoulder away from the body (mimicking the abduction sling).  - Gently pat the area dry.  - Do not soak the shoulder in water. Do not go swimming in the pool or ocean until your sutures are removed. - KEEP THE INCISIONS CLEAN AND DRY.  EXERCISES Wear the sling at all times except when doing your exercises. You may remove the sling for showering, but keep the arm across the chest or in a secondary sling.    Accidental/Purposeful External Rotation and shoulder flexion (reaching behind you) is to be avoided at all costs for the first month. Please perform the exercises:   Hand / Wrist  Range of Motion Exercises Grip strengthening   REGIONAL ANESTHESIA (NERVE BLOCKS) The anesthesia team may have performed a nerve block for you if safe in the setting of your care.  This is a great tool used to minimize pain.  Typically the block may start wearing off overnight but the long acting medicine may  last for 3-4 days.  The nerve block wearing off can be a challenging period but please utilize your as needed pain medications to try and manage this period.    POST-OP MEDICATIONS- Multimodal approach to pain control In general your pain will be controlled with a combination of substances.  Prescriptions unless otherwise discussed are electronically sent to your pharmacy.  This is a carefully made plan we use to minimize narcotic use.     Ibuprofen - Anti-inflammatory medication taken on a scheduled basis Hydrocodone - This is a strong narcotic, to be used only on an "as needed" basis for pain.  This medication contains tylenol/acetaminophen.  Do not take additional tylenol/acetaminophen if you are taking the hydrocodone on a regular basis.  Aspirin '81mg'$  - This medicine is used to minimize the risk of blood clots after surgery. Zofran -  take as needed for nausea   Meloxicam/Celebrex - these are anti-inflammatory and pain relievers.  Do not take additional ibuprofen, naproxen or other NSAID while taking this medicine.   FOLLOW-UP If you develop a Fever (>101.5), Redness or Drainage from the surgical incision site, please call our office to arrange for an evaluation. Please call the office to schedule a follow-up appointment for a wound check, 7-10 days post-operatively.  IF YOU HAVE ANY QUESTIONS, PLEASE FEEL FREE TO CALL OUR OFFICE.  HELPFUL INFORMATION  If you  had a block, it will wear off between 8-24 hrs postop typically.  This is period when your pain may go from nearly zero to the pain you would have had post-op without the block.  This is an abrupt transition but nothing dangerous is happening.  You may take an extra dose of narcotic when this happens.  Your arm will be in a sling following surgery. You will be in this sling for the next 3-4 weeks.  I will let you know the exact duration at your follow-up visit.  You may be more comfortable sleeping in a semi-seated position the  first few nights following surgery.  Keep a pillow propped under the elbow and forearm for comfort.  If you have a recliner type of chair it might be beneficial.  If not that is fine too, but it would be helpful to sleep propped up with pillows behind your operated shoulder as well under your elbow and forearm.  This will reduce pulling on the suture lines.  When dressing, put your operative arm in the sleeve first.  When getting undressed, take your operative arm out last.  Loose fitting, button-down shirts are recommended.  In most states it is against the law to drive while your arm is in a sling. And certainly against the law to drive while taking narcotics.  You may return to work/school in the next couple of days when you feel up to it. Desk work and typing in the sling is fine.  We suggest you use the pain medication the first night prior to going to bed, in order to ease any pain when the anesthesia wears off. You should avoid taking pain medications on an empty stomach as it will make you nauseous.  Do not drink alcoholic beverages or take illicit drugs when taking pain medications.  Pain medication may make you constipated.  Below are a few solutions to try in this order: Decrease the amount of pain medication if you aren't having pain. Drink lots of decaffeinated fluids. Drink prune juice and/or each dried prunes  If the first 3 don't work start with additional solutions Take Colace - an over-the-counter stool softener Take Senokot - an over-the-counter laxative Take Miralax - a stronger over-the-counter laxative   Dental Antibiotics:  In most cases prophylactic antibiotics for Dental procdeures after total joint surgery are not necessary.  Exceptions are as follows:  1. History of prior total joint infection  2. Severely immunocompromised (Organ Transplant, cancer chemotherapy, Rheumatoid biologic meds such as [place])  3. Poorly controlled diabetes (A1C &gt; 8.0, blood  glucose over 200)  If you have one of these conditions, contact your surgeon for an antibiotic prescription, prior to your dental procedure.

## 2022-06-25 NOTE — Telephone Encounter (Signed)
Left VM for a call back and detailed information of what's needed.

## 2022-06-26 ENCOUNTER — Encounter: Payer: Self-pay | Admitting: Orthopedic Surgery

## 2022-06-26 DIAGNOSIS — Z7984 Long term (current) use of oral hypoglycemic drugs: Secondary | ICD-10-CM | POA: Diagnosis not present

## 2022-06-26 DIAGNOSIS — I10 Essential (primary) hypertension: Secondary | ICD-10-CM | POA: Diagnosis not present

## 2022-06-26 DIAGNOSIS — R2681 Unsteadiness on feet: Secondary | ICD-10-CM | POA: Diagnosis not present

## 2022-06-26 DIAGNOSIS — S42201A Unspecified fracture of upper end of right humerus, initial encounter for closed fracture: Secondary | ICD-10-CM | POA: Diagnosis not present

## 2022-06-26 DIAGNOSIS — E119 Type 2 diabetes mellitus without complications: Secondary | ICD-10-CM | POA: Diagnosis not present

## 2022-06-26 DIAGNOSIS — X58XXXA Exposure to other specified factors, initial encounter: Secondary | ICD-10-CM | POA: Diagnosis not present

## 2022-06-26 DIAGNOSIS — R2689 Other abnormalities of gait and mobility: Secondary | ICD-10-CM | POA: Diagnosis not present

## 2022-06-26 LAB — BPAM RBC
Blood Product Expiration Date: 202403012359
Blood Product Expiration Date: 202403012359
ISSUE DATE / TIME: 202402010959
ISSUE DATE / TIME: 202402011056
Unit Type and Rh: 6200
Unit Type and Rh: 6200

## 2022-06-26 LAB — TYPE AND SCREEN
ABO/RH(D): A POS
Antibody Screen: NEGATIVE
Unit division: 0
Unit division: 0

## 2022-06-26 MED ORDER — HYDROCODONE-ACETAMINOPHEN 5-325 MG PO TABS: 1.0000 | ORAL_TABLET | ORAL | Status: DC | PRN

## 2022-06-26 MED ORDER — ASPIRIN 81 MG PO TBEC: 81.0000 mg | DELAYED_RELEASE_TABLET | Freq: Two times a day (BID) | ORAL | 0 refills | Status: AC

## 2022-06-26 MED ORDER — HYDROCODONE-ACETAMINOPHEN 5-325 MG PO TABS: 1.0000 | ORAL_TABLET | ORAL | 0 refills | Status: DC | PRN

## 2022-06-26 MED ORDER — HYDROCODONE-ACETAMINOPHEN 5-325 MG PO TABS
2.0000 | ORAL_TABLET | ORAL | Status: DC | PRN
  Administered 2022-06-26: 2 via ORAL
  Filled 2022-06-26: qty 2

## 2022-06-26 MED ORDER — ONDANSETRON HCL 4 MG PO TABS: 4.0000 mg | ORAL_TABLET | Freq: Three times a day (TID) | ORAL | 0 refills | Status: AC | PRN

## 2022-06-26 MED ORDER — IBUPROFEN 600 MG PO TABS: 600.0000 mg | ORAL_TABLET | Freq: Three times a day (TID) | ORAL | 0 refills | Status: DC

## 2022-06-26 NOTE — Evaluation (Signed)
Physical Therapy Evaluation Patient Details Name: April Murillo MRN: [id number] DOB: July 17, 1950 Today's Date: 06/26/2022  History of Present Illness  April Murillo sustained a right proximal humerus fracture, including dislocation of the humeral head articular fragment.  Given the severity of her injury, including comminution and dislocation of the humeral head, I do not think this is amenable to operative fixation.  As such, I am recommending a reverse shoulder arthroplasty for fracture.  Procedure was discussed in full detail.  All questions were answered.  She has elected to proceed.   Clinical Impression  Patient seen with OT. Patient does not require assist with bed mobility and demonstrates good sitting tolerance and sitting balance at EOB. Assisted patient with adjusting sling properly and educated on precautions. Patient able to transfer to standing and ambulate without assist or AD. She is educated on obtaining referral for outpatient PT for her knee if she continues to have issues with it following rehab for shoulder. Patient discharged to care of nursing for ambulation daily as tolerated for length of stay.      Recommendations for follow up therapy are one component of a multi-disciplinary discharge planning process, led by the attending physician.  Recommendations may be updated based on patient status, additional functional criteria and insurance authorization.  Follow Up Recommendations No PT follow up      Assistance Recommended at Discharge PRN  Patient can return home with the following  A little help with bathing/dressing/bathroom;Assistance with cooking/housework    Equipment Recommendations None recommended by PT  Recommendations for Other Services       Functional Status Assessment Patient has had a recent decline in their functional status and demonstrates the ability to make significant improvements in function in a reasonable and predictable amount of  time.     Precautions / Restrictions Precautions Precautions: Shoulder Shoulder Interventions: Shoulder sling/immobilizer Precaution Booklet Issued: Yes (comment) Restrictions Weight Bearing Restrictions: Yes RUE Weight Bearing: Non weight bearing      Mobility  Bed Mobility Overal bed mobility: Modified Independent                  Transfers Overall transfer level: Needs assistance Equipment used: None Transfers: Sit to/from Stand Sit to Stand: Supervision, Modified independent (Device/Increase time)                Ambulation/Gait Ambulation/Gait assistance: Supervision, Modified independent (Device/Increase time) Gait Distance (Feet): 200 Feet Assistive device: None Gait Pattern/deviations: Step-through pattern, Decreased stride length Gait velocity: decreased     General Gait Details: slight intermittent unsteadiness  Stairs            Wheelchair Mobility    Modified Rankin (Stroke Patients Only)       Balance Overall balance assessment: No apparent balance deficits (not formally assessed)                                           Pertinent Vitals/Pain Pain Assessment Pain Descriptors / Indicators: Grimacing, Guarding Pain Intervention(s): Limited activity within patient's tolerance, Monitored during session, Premedicated before session, Repositioned    Home Living Family/patient expects to be discharged to:: Private residence Living Arrangements: Other relatives Available Help at Discharge: Family Type of Home: House Home Access: Stairs to enter Entrance Stairs-Rails: Right;Left;Can reach both Technical brewer of Steps: 4   Home Layout: One level Home Equipment: Cane - single point  Prior Function Prior Level of Function : Independent/Modified Independent;Working/employed;Driving             Mobility Comments: community ambulation ADLs Comments: independent     Hand Dominance   Dominant  Hand: Right    Extremity/Trunk Assessment   Upper Extremity Assessment Upper Extremity Assessment: Defer to OT evaluation    Lower Extremity Assessment Lower Extremity Assessment: Overall WFL for tasks assessed    Cervical / Trunk Assessment Cervical / Trunk Assessment: Normal  Communication   Communication: No difficulties  Cognition Arousal/Alertness: Awake/alert Behavior During Therapy: WFL for tasks assessed/performed Overall Cognitive Status: Within Functional Limits for tasks assessed                                          General Comments      Exercises     Assessment/Plan    PT Assessment Patient does not need any further PT services  PT Problem List         PT Treatment Interventions      PT Goals (Current goals can be found in the Care Plan section)  Acute Rehab PT Goals Patient Stated Goal: return home PT Goal Formulation: With patient Time For Goal Achievement: 06/26/22 Potential to Achieve Goals: Good    Frequency       Co-evaluation PT/OT/SLP Co-Evaluation/Treatment: Yes Reason for Co-Treatment: To address functional/ADL transfers PT goals addressed during session: Mobility/safety with mobility;Balance;Strengthening/ROM OT goals addressed during session: ADL's and self-care       AM-PAC PT "6 Clicks" Mobility  Outcome Measure Help needed turning from your back to your side while in a flat bed without using bedrails?: None Help needed moving from lying on your back to sitting on the side of a flat bed without using bedrails?: None Help needed moving to and from a bed to a chair (including a wheelchair)?: None Help needed standing up from a chair using your arms (e.g., wheelchair or bedside chair)?: None Help needed to walk in hospital room?: None Help needed climbing 3-5 steps with a railing? : None 6 Click Score: 24    End of Session   Activity Tolerance: Patient tolerated treatment well Patient left: in chair;with  call bell/phone within reach Nurse Communication: Mobility status PT Visit Diagnosis: Other abnormalities of gait and mobility (R26.89)    Time: 2951-8841 PT Time Calculation (min) (ACUTE ONLY): 22 min   Charges:   PT Evaluation $PT Eval Low Complexity: 1 Low          10:26 AM, 06/26/22 Mearl Latin PT, DPT Physical Therapist at [facility]

## 2022-06-26 NOTE — Evaluation (Signed)
Occupational Therapy Evaluation Patient Details Name: April Murillo MRN: [id number] DOB: 18-Jan-1951 Today's Date: 06/26/2022   History of Present Illness April Murillo sustained a right proximal humerus fracture, including dislocation of the humeral head articular fragment.  Given the severity of her injury, including comminution and dislocation of the humeral head, I do not think this is amenable to operative fixation.  As such, I am recommending a reverse shoulder arthroplasty for fracture.  Procedure was discussed in full detail.  All questions were answered.  She has elected to proceed.   Clinical Impression   Pt agreeable to OT and PT co-evaluation. Pt given handout on donning donjoy sling. Pt assisted in adjusting sling for better fit. Pt able to ambulate in hall and complete lower body dressing without assist. Pt reports having family support at home if needed. Pt is not recommended for further acute OT services and will be discharged to care of nursing staff for remaining length of stay.        Recommendations for follow up therapy are one component of a multi-disciplinary discharge planning process, led by the attending physician.  Recommendations may be updated based on patient status, additional functional criteria and insurance authorization.   Follow Up Recommendations  Outpatient OT     Assistance Recommended at Discharge PRN  Patient can return home with the following A little help with bathing/dressing/bathroom;Assist for transportation;Assistance with cooking/housework    Functional Status Assessment  Patient has had a recent decline in their functional status and demonstrates the ability to make significant improvements in function in a reasonable and predictable amount of time.  Equipment Recommendations  None recommended by OT    Recommendations for Other Services       Precautions / Restrictions Precautions Precautions: Shoulder Shoulder Interventions:  Shoulder sling/immobilizer Precaution Booklet Issued: Yes (comment) Restrictions Weight Bearing Restrictions: Yes RUE Weight Bearing: Non weight bearing      Mobility Bed Mobility Overal bed mobility: Modified Independent                  Transfers Overall transfer level: Needs assistance Equipment used: None Transfers: Sit to/from Stand Sit to Stand: Min guard, Supervision                  Balance    Independent                                        ADL either performed or assessed with clinical judgement   ADL Overall ADL's : Modified independent                                       General ADL Comments: Pt able to don and doff sock seated in recliner. Mild labored effort. Transfers completed without physical assist. Pt may need a little assist  dressing depending on what she is attempting to wear. Possible PRN assist for bathing.     Vision Baseline Vision/History: 1 Wears glasses Ability to See in Adequate Light: 0 Adequate Patient Visual Report: No change from baseline Vision Assessment?: No apparent visual deficits                Pertinent Vitals/Pain Pain Assessment Pain Assessment: 0-10 Pain Score: 10-Worst pain ever Pain Descriptors / Indicators: Grimacing, Guarding Pain Intervention(s): Limited activity  within patient's tolerance, Monitored during session, Repositioned     Hand Dominance Right   Extremity/Trunk Assessment Upper Extremity Assessment Upper Extremity Assessment: RUE deficits/detail RUE Deficits / Details: NWB R UE; sling adjusted for better fit.   Lower Extremity Assessment Lower Extremity Assessment: Defer to PT evaluation   Cervical / Trunk Assessment Cervical / Trunk Assessment: Normal   Communication Communication Communication: No difficulties   Cognition Arousal/Alertness: Awake/alert Behavior During Therapy: WFL for tasks assessed/performed Overall Cognitive Status:  Within Functional Limits for tasks assessed                                                        Home Living Family/patient expects to be discharged to:: Private residence Living Arrangements: Other relatives Available Help at Discharge: Family Type of Home: House Home Access: Stairs to enter Technical brewer of Steps: 4 Entrance Stairs-Rails: Right;Left;Can reach both Home Layout: One level     Bathroom Shower/Tub: Teacher, early years/pre: Standard Bathroom Accessibility: Yes How Accessible: Accessible via walker Home Equipment: Cane - single point          Prior Functioning/Environment Prior Level of Function : Independent/Modified Independent;Working/employed;Driving             Mobility Comments: community ambulation ADLs Comments: independent                                Co-evaluation PT/OT/SLP Co-Evaluation/Treatment: Yes Reason for Co-Treatment: To address functional/ADL transfers PT goals addressed during session: Mobility/safety with mobility;Balance;Strengthening/ROM OT goals addressed during session: ADL's and self-care      AM-PAC OT "6 Clicks" Daily Activity     Outcome Measure Help from another person eating meals?: None Help from another person taking care of personal grooming?: None Help from another person toileting, which includes using toliet, bedpan, or urinal?: None Help from another person bathing (including washing, rinsing, drying)?: A Little Help from another person to put on and taking off regular upper body clothing?: A Little Help from another person to put on and taking off regular lower body clothing?: A Little 6 Click Score: 21   End of Session Equipment Utilized During Treatment: Other (comment) (Donjoy II sling)  Activity Tolerance: Patient tolerated treatment well Patient left: in chair;with call bell/phone within reach  OT Visit Diagnosis: Muscle weakness (generalized)  (M62.81)                Time: 9563-8756 OT Time Calculation (min): 22 min Charges:  OT General Charges $OT Visit: 1 Visit OT Evaluation $OT Eval Low Complexity: 1 Low  April Murillo OT, MOT  [employer] 06/26/2022, 10:25 AM

## 2022-06-26 NOTE — Progress Notes (Signed)
  Transition of Care [facility]) Screening Note   Patient Details  Name: April Murillo Date of Birth: 10/08/1950   Transition of Care [facility]) CM/SW Contact:    Iona Beard, [place] Phone Number: 06/26/2022, 10:16 AM    Transition of Care Department [facility]) has reviewed patient and no TOC needs have been identified at this time. We will continue to monitor patient advancement through interdisciplinary progression rounds. If new patient transition needs arise, please place a TOC consult.

## 2022-06-26 NOTE — Discharge Summary (Signed)
Patient ID: April Murillo MRN: [id number] DOB/AGE: 1950-07-06 72 y.o.  Admit date: 06/25/2022 Discharge date: 06/26/2022  Admission Diagnoses: Closed 4 part fracture of right proximal humerus, with dislocation  Discharge Diagnoses:  Principal Problem:   Closed 4-part fracture of proximal humerus   Past Medical History:  Diagnosis Date   Arthritis    Diabetes mellitus without complication ([place])    Hypercholesteremia    Hypertension      Procedures Performed: Right Reverse Shoulder Arthroplasty for a Fracture  Discharged Condition: good  Hospital Course: Patient brought in as an outpatient for surgery.  Tolerated procedure well.  Was kept for monitoring overnight for pain control and medical monitoring postop and was found to be stable for DC home the morning after surgery.  Patient was evaluated by PT/OT prior to discharge.  Patient was instructed on specific activity restrictions and all questions were answered.  Patient was discharged on POD#1 in stable condition.  They will contact the clinic if they have any concerns upon discharge.    Consults: None  Significant Diagnostic Studies: No additional pertinent studies  Treatments: Surgery  Discharge Exam:      06/26/2022    5:00 AM 06/25/2022   11:16 PM 06/25/2022    3:40 PM  Vitals with BMI  Height   '5\' 6"'$   Weight   190 lbs  BMI   20.94  Systolic 709 628 366  Diastolic 75 60 82  Pulse 92 99 103     Alert and oriented, no acute distress  Sling and ice machine fitting appropriately. Dressing is clean dry and intact Active motion intact throughout the hand Sensation intact in the axillary nerve distribution. Fingers are warm and well perfused   CBC    Latest Ref Rng & Units 06/25/2022   12:30 PM 06/25/2022    6:44 AM 06/18/2022   11:37 AM  CBC  WBC 4.0 - 10.5 K/uL  6.6  6.4   Hemoglobin 12.0 - 15.0 g/dL 10.9  9.7  9.0   Hematocrit 36.0 - 46.0 % 32.4  29.1  26.9   Platelets 150 - 400 K/uL  316  265          Disposition: Discharge disposition: 01-Home or Self Care        Allergies as of 06/26/2022       Reactions   Oxycodone Hcl Nausea Only   Pt states even when she cuts tablet in half it still makes her sick        Medication List     STOP taking these medications    celecoxib 200 MG capsule Commonly known as: CELEBREX       TAKE these medications    amLODipine 5 MG tablet Commonly known as: NORVASC Take 5 mg by mouth daily.   aspirin EC 81 MG tablet Take 1 tablet (81 mg total) by mouth in the morning and at bedtime. Swallow whole.   Dulaglutide 1.5 MG/0.5ML Sopn Inject 1.5 mg into the skin once a week.   Fish Oil 1000 MG Caps Take 1,000 mg by mouth in the morning and at bedtime.   gabapentin 300 MG capsule Commonly known as: NEURONTIN Take 300 mg by mouth at bedtime.   hydrochlorothiazide 25 MG tablet Commonly known as: HYDRODIURIL Take 25 mg by mouth daily.   HYDROcodone-acetaminophen 5-325 MG tablet Commonly known as: NORCO/VICODIN Take 1-2 tablets by mouth every 4 (four) hours as needed for up to 7 days for moderate pain. What changed:  how  much to take when to take this   ibuprofen 600 MG tablet Commonly known as: ADVIL Take 1 tablet (600 mg total) by mouth every 8 (eight) hours for 14 days. What changed:  when to take this reasons to take this   losartan 100 MG tablet Commonly known as: COZAAR Take 100 mg by mouth daily.   metFORMIN 1000 MG tablet Commonly known as: GLUCOPHAGE Take 1,000 mg by mouth 2 (two) times daily with a meal.   omeprazole 20 MG capsule Commonly known as: PRILOSEC Take 20 mg by mouth daily.   ondansetron 4 MG tablet Commonly known as: Zofran Take 1 tablet (4 mg total) by mouth every 8 (eight) hours as needed for up to 14 days for nausea or vomiting.   simvastatin 10 MG tablet Commonly known as: ZOCOR Take 10 mg by mouth daily.   sodium chloride 1 g tablet Take 1 tablet (1 g total) by mouth 3 (three)  times daily for 4 days.        Follow-up Information     Mordecai Rasmussen, MD Follow up on 07/14/2022.   Specialties: Orthopedic Surgery, Sports Medicine Contact information: [address] [place] [address] [phone]

## 2022-07-01 ENCOUNTER — Other Ambulatory Visit: Payer: Self-pay | Admitting: Orthopedic Surgery

## 2022-07-01 ENCOUNTER — Encounter (HOSPITAL_COMMUNITY): Payer: Self-pay | Admitting: Orthopedic Surgery

## 2022-07-01 MED ORDER — HYDROCODONE-ACETAMINOPHEN 5-325 MG PO TABS: 1.0000 | ORAL_TABLET | ORAL | 0 refills | Status: DC | PRN

## 2022-07-01 NOTE — Telephone Encounter (Signed)
Patient lvm requesting a refill on her Hydrocodone 5-325, 30 quantity, every 4hrs prn to be sent to [facility].

## 2022-07-02 MED ORDER — HYDROCODONE-ACETAMINOPHEN 5-325 MG PO TABS: 1.0000 | ORAL_TABLET | ORAL | 0 refills | Status: AC | PRN

## 2022-07-02 NOTE — Telephone Encounter (Signed)
I have s/w patient and she has not been notified by [facility] that Rx is ready, she will call them.  I apologized for her being unable to reach [place], and the delay in [place] responding to her VM she had left.

## 2022-07-02 NOTE — Telephone Encounter (Signed)
Amy/Wendy, please look at this, I routed to the wrong person yesterday.    Patient lvm requesting a refill on her Hydrocodone 5-325, 30 quantity, every 4hrs prn to be sent to [facility].

## 2022-07-06 ENCOUNTER — Telehealth: Payer: Self-pay | Admitting: Orthopedic Surgery

## 2022-07-06 NOTE — Telephone Encounter (Signed)
April Murillo, this patient called to see when her f/u appt is.  Per her op note it should be 07/09/22, but Abigail Butts told me to send you a message to see if Dr. Amedeo Kinsman advised differently since he is not here this week.  Please advise.

## 2022-07-07 ENCOUNTER — Telehealth: Payer: Self-pay | Admitting: Orthopedic Surgery

## 2022-07-07 NOTE — Telephone Encounter (Signed)
Pt can come in on the 19th or 20th.

## 2022-07-15 ENCOUNTER — Ambulatory Visit (INDEPENDENT_AMBULATORY_CARE_PROVIDER_SITE_OTHER): Payer: Medicare HMO

## 2022-07-15 ENCOUNTER — Encounter: Payer: Self-pay | Admitting: Orthopedic Surgery

## 2022-07-15 ENCOUNTER — Ambulatory Visit (INDEPENDENT_AMBULATORY_CARE_PROVIDER_SITE_OTHER): Payer: Medicare HMO | Admitting: Orthopedic Surgery

## 2022-07-15 VITALS — Ht 66.0 in | Wt 190.0 lb

## 2022-07-15 DIAGNOSIS — Z96611 Presence of right artificial shoulder joint: Secondary | ICD-10-CM

## 2022-07-15 NOTE — Progress Notes (Signed)
Orthopaedic Postop Note  Assessment: April Murillo is a 72 y.o. female s/p Right Reverse Shoulder Arthroplasty for a Fracture  DOS: 06/25/2022  Plan: Sutures were trimmed, steri strips were placed Ok to remove the abduction pillow; can stop using the sling around 4 weeks postop Physical therapy prescription and protocol provided, to begin in 1-2 weeks Anticipated progression discussed, XR reviewed in clinic Follow up 4 weeks    Follow-up: Return in about 4 weeks (around 08/12/2022).  XR at next visit: Right shoulder  Subjective:  Chief Complaint  Patient presents with   Routine Post Op    R shoulder DOS 06/25/22    History of Present Illness: April Murillo is a 73 y.o. female who presents following the above stated procedure.  Surgery was approximately 3 weeks ago.  She states that she has not taken pain medication in about a week.  She continues to take ibuprofen.  She feels well overall.  No numbness or tingling.  Review of Systems: No fevers or chills No numbness or tingling No Chest Pain No shortness of breath   Objective: Ht 5' 6"$  (1.676 m)   Wt 190 lb (86.2 kg)   BMI 30.67 kg/m   Physical Exam:  Alert and oriented.  No acute distress.  Anterior based shoulder incision is healing well.  No surrounding erythema or drainage.  No tenderness to palpation.  Sensation intact over the axillary nerve distribution.  Deltoid fires.  Sensation intact throughout the right hand.  2+ radial pulse.  IMAGING: I personally ordered and reviewed the following images:  XR of the Right shoulder was obtained in clinic today and demonstrates shoulder arthroplasty with implants in good position.  No evidence of acute injury or subsidence of implants.  Tuberosities remain in good position.  No evidence of iatrogenic fracture.  Impression: Right shoulder arthroplasty in good position   April Rasmussen, MD 07/15/2022 11:24 AM

## 2022-07-15 NOTE — Patient Instructions (Signed)
Okay to remove the pillow  Physical therapy to start in about 2 weeks  Continue to use the sling for another 2 weeks  Follow-up in 4 weeks.

## 2022-07-23 ENCOUNTER — Encounter: Payer: Self-pay | Admitting: Radiology

## 2022-07-24 ENCOUNTER — Telehealth: Payer: Self-pay | Admitting: Orthopaedic Surgery

## 2022-07-30 ENCOUNTER — Ambulatory Visit (HOSPITAL_COMMUNITY): Payer: Medicare HMO | Attending: Orthopedic Surgery | Admitting: Occupational Therapy

## 2022-07-30 DIAGNOSIS — R29898 Other symptoms and signs involving the musculoskeletal system: Secondary | ICD-10-CM | POA: Diagnosis present

## 2022-07-30 DIAGNOSIS — M25611 Stiffness of right shoulder, not elsewhere classified: Secondary | ICD-10-CM | POA: Insufficient documentation

## 2022-07-30 DIAGNOSIS — Z96611 Presence of right artificial shoulder joint: Secondary | ICD-10-CM | POA: Diagnosis not present

## 2022-07-30 DIAGNOSIS — M25511 Pain in right shoulder: Secondary | ICD-10-CM | POA: Diagnosis present

## 2022-07-30 NOTE — Therapy (Signed)
OUTPATIENT OCCUPATIONAL THERAPY ORTHO EVALUATION  Patient Name: April Murillo MRN: [id number] DOB:December 01, 1950, 72 y.o., female Today's Date: 08/02/2022  PCP: Stoney Bang, MD REFERRING PROVIDER: Larena Glassman, MD  END OF SESSION:  OT End of Session - 08/02/22 2201     Visit Number 1    Number of Visits 17    Date for OT Re-Evaluation 10/02/22    Authorization Type [employer]    Progress Note Due on Visit 10    OT Start Time 0900    OT Stop Time 0945    OT Time Calculation (min) 45 min    Activity Tolerance Patient tolerated treatment well    Behavior During Therapy [facility] for tasks assessed/performed             Past Medical History:  Diagnosis Date   Arthritis    Diabetes mellitus without complication ([place])    Hypercholesteremia    Hypertension    Past Surgical History:  Procedure Laterality Date   CATARACT EXTRACTION W/PHACO Right 09/18/2019   Procedure: CATARACT EXTRACTION PHACO AND INTRAOCULAR LENS PLACEMENT RIGHT EYE;  Surgeon: Baruch Goldmann, MD;  Location: [facility];  Service: Ophthalmology;  Laterality: Right;  CDE: 6.15   CATARACT EXTRACTION W/PHACO Left 10/02/2019   Procedure: CATARACT EXTRACTION PHACO AND INTRAOCULAR LENS PLACEMENT ([place]) CDE:;  Surgeon: Baruch Goldmann, MD;  Location: [facility];  Service: Ophthalmology;  Laterality: Left;   CLOSED REDUCTION / MANIPULATION JOINT     COLONOSCOPY     REVERSE SHOULDER ARTHROPLASTY Right 06/25/2022   Procedure: REVERSE SHOULDER ARTHROPLASTY;  Surgeon: Mordecai Rasmussen, MD;  Location: [facility];  Service: Orthopedics;  Laterality: Right;   Patient Active Problem List   Diagnosis Date Noted   Closed 4-part fracture of proximal humerus 06/25/2022   Bilateral primary osteoarthritis of knee 02/02/2019   Body mass index 36.0-36.9, adult 02/02/2019    ONSET DATE: 07/13/22  REFERRING DIAG: R Reverse Total Shoulder Surgery  THERAPY DIAG:  Other symptoms and signs involving the musculoskeletal system  Acute pain of right  shoulder  Shoulder stiffness, right  Rationale for Evaluation and Treatment: Rehabilitation  SUBJECTIVE:   SUBJECTIVE STATEMENT: "I'm just ready to use my arm." Pt accompanied by: self  PERTINENT HISTORY: Pt fell on 06/10/22 fracturing her humerus and injuring her shoulder. Overall pt has been in an immobilizer ever since.   PRECAUTIONS: Shoulder  WEIGHT BEARING RESTRICTIONS: Yes No lifting  PAIN:  Are you having pain? Yes: NPRS scale: 2/10 Pain location: surgical site Pain description: Uncomfortable Aggravating factors: sling Relieving factors: medication  FALLS: Has patient fallen in last 6 months? Yes. Number of falls 1  LIVING ENVIRONMENT: Lives with: lives with their family Lives in: House/apartment  PLOF: Independent  PATIENT GOALS: To get use of her arm back  NEXT MD VISIT: 2 weeks  OBJECTIVE:   HAND DOMINANCE: Right  ADLs: Overall ADLs: Pt requiring assist with all ADL's due to being unable to move her arm, she is requring total assist for cleaning and most cooking tasks.   FUNCTIONAL OUTCOME MEASURES: FOTO: 29.81  UPPER EXTREMITY ROM:     Passive ROM In Supine Right eval  Shoulder flexion 113  Shoulder abduction 104  Shoulder internal rotation 90  Shoulder external rotation 49  Elbow flexion 140  Elbow extension -22  (Blank rows = not tested)  UPPER EXTREMITY MMT:     MMT Right eval  Shoulder flexion   Shoulder abduction   Shoulder adduction   Shoulder extension  Shoulder internal rotation   Shoulder external rotation   Elbow flexion   Elbow extension   (Blank rows = not tested)  SENSATION: WFL  EDEMA: No Swelling Noted  OBSERVATIONS: Severe fasical restrictions along the biceps, trapezius, and axillary region.    TODAY'S TREATMENT:                                                                                                                              DATE: 07/30/22: Evaluation Only    PATIENT EDUCATION: Education  details: Elbow and Wrist ROM, Pendulums Person educated: Patient Education method: Explanation, Demonstration, and Handouts Education comprehension: verbalized understanding and returned demonstration  HOME EXERCISE PROGRAM: 3/7: Elbow and Wrist ROM, Pendulums  GOALS: Goals reviewed with patient? Yes  SHORT TERM GOALS: Target date: 08/28/22  Pt will be provided with and educated on HEP to improve mobility in RUE required for use during ADL completion.  Goal status: INITIAL  2.  Pt will increase RUE P/ROM by 40 degrees or greater to improve ability to use RUE during dressing tasks with minimal compensatory techniques.  Goal status: INITIAL  3.  Pt will increase RUE strength to 3+/5 to improve ability to reach for items at waist to chest height during bathing and grooming tasks.  Goal status: INITIAL  LONG TERM GOALS: Target date: 09/25/22  Pt will decrease pain in RUE to 3/10 or less to improve ability to sleep for 2+ consecutive hours without waking due to pain.  Goal status: INITIAL  2.  Pt will decrease RUE fascial restrictions to min amounts or less to improve mobility required for functional reaching tasks. Goal status: INITIAL  3.   Pt will increase RUE A/ROM to at least 145 for flexion/abduction, and 50 degrees er/IR to improve ability to use RUE when reaching overhead or behind back during dressing and bathing tasks.  Goal status: INITIAL  4.  Pt will increase RUE strength to 4+/5 or greater to improve ability to use LUE when lifting or carrying items during meal preparation/housework/yardwork tasks. Goal status: INITIAL  5.  Pt will return to highest level of function using RUE as dominant during functional task completion.  Goal status: INITIAL  ASSESSMENT:  CLINICAL IMPRESSION: Patient is a 72 y.o. female who was seen today for occupational therapy evaluation for s/p R Reverse Total Shoulder Replacement following a fall with humerus fracture. Pt has been in an  immobilize since January requiring assist with all ADL's and IADL's.   PERFORMANCE DEFICITS: in functional skills including ADLs, IADLs, edema, ROM, strength, pain, fascial restrictions, Gross motor control, body mechanics, and UE functional use.  IMPAIRMENTS: are limiting patient from ADLs, IADLs, rest and sleep, leisure, and social participation.   COMORBIDITIES: has no other co-morbidities that affects occupational performance. Patient will benefit from skilled OT to address above impairments and improve overall function.  MODIFICATION OR ASSISTANCE TO COMPLETE EVALUATION: No modification of tasks or assist necessary to complete an  evaluation.  OT OCCUPATIONAL PROFILE AND HISTORY: Problem focused assessment: Including review of records relating to presenting problem.  CLINICAL DECISION MAKING: LOW - limited treatment options, no task modification necessary  REHAB POTENTIAL: Excellent  EVALUATION COMPLEXITY: Low      PLAN:  OT FREQUENCY: 2x/week  OT DURATION: 8 weeks  PLANNED INTERVENTIONS: self care/ADL training, therapeutic exercise, therapeutic activity, manual therapy, scar mobilization, passive range of motion, functional mobility training, electrical stimulation, ultrasound, moist heat, cryotherapy, patient/family education, coping strategies training, and DME and/or AE instructions  RECOMMENDED OTHER SERVICES: N/A  CONSULTED AND AGREED WITH PLAN OF CARE: Patient  PLAN FOR NEXT SESSION: Manual Therapy, P/ROM, thumb tacs, ball rolls, table slides  Paulita Fujita, OTR/L [facility] [place], [place] 08/02/2022, 10:10 PM

## 2022-08-03 ENCOUNTER — Ambulatory Visit (HOSPITAL_COMMUNITY): Payer: Medicare HMO | Admitting: Occupational Therapy

## 2022-08-03 ENCOUNTER — Encounter (HOSPITAL_COMMUNITY): Payer: Self-pay | Admitting: Occupational Therapy

## 2022-08-03 DIAGNOSIS — R29898 Other symptoms and signs involving the musculoskeletal system: Secondary | ICD-10-CM

## 2022-08-03 DIAGNOSIS — M25511 Pain in right shoulder: Secondary | ICD-10-CM | POA: Diagnosis not present

## 2022-08-03 DIAGNOSIS — M25611 Stiffness of right shoulder, not elsewhere classified: Secondary | ICD-10-CM | POA: Diagnosis not present

## 2022-08-03 DIAGNOSIS — Z96611 Presence of right artificial shoulder joint: Secondary | ICD-10-CM | POA: Diagnosis not present

## 2022-08-03 NOTE — Patient Instructions (Signed)

## 2022-08-03 NOTE — Therapy (Signed)
OUTPATIENT OCCUPATIONAL THERAPY ORTHO TREATMENT NOTE  Patient Name: April Murillo MRN: [id number] DOB:1950/06/24, 72 y.o., female Today's Date: 08/03/2022  PCP: Stoney Bang, MD REFERRING PROVIDER: Larena Glassman, MD  END OF SESSION:  OT End of Session - 08/03/22 1112     Visit Number 2    Number of Visits 17    Date for OT Re-Evaluation 10/02/22    Authorization Type [employer]    Progress Note Due on Visit 10    OT Start Time 1037    OT Stop Time 1115    OT Time Calculation (min) 38 min    Activity Tolerance Patient tolerated treatment well    Behavior During Therapy WFL for tasks assessed/performed             Past Medical History:  Diagnosis Date   Arthritis    Diabetes mellitus without complication ([place])    Hypercholesteremia    Hypertension    Past Surgical History:  Procedure Laterality Date   CATARACT EXTRACTION W/PHACO Right 09/18/2019   Procedure: CATARACT EXTRACTION PHACO AND INTRAOCULAR LENS PLACEMENT RIGHT EYE;  Surgeon: Baruch Goldmann, MD;  Location: [facility];  Service: Ophthalmology;  Laterality: Right;  CDE: 6.15   CATARACT EXTRACTION W/PHACO Left 10/02/2019   Procedure: CATARACT EXTRACTION PHACO AND INTRAOCULAR LENS PLACEMENT ([place]) CDE:;  Surgeon: Baruch Goldmann, MD;  Location: [facility];  Service: Ophthalmology;  Laterality: Left;   CLOSED REDUCTION / MANIPULATION JOINT     COLONOSCOPY     REVERSE SHOULDER ARTHROPLASTY Right 06/25/2022   Procedure: REVERSE SHOULDER ARTHROPLASTY;  Surgeon: Mordecai Rasmussen, MD;  Location: [facility];  Service: Orthopedics;  Laterality: Right;   Patient Active Problem List   Diagnosis Date Noted   Closed 4-part fracture of proximal humerus 06/25/2022   Bilateral primary osteoarthritis of knee 02/02/2019   Body mass index 36.0-36.9, adult 02/02/2019    ONSET DATE: 07/13/22  REFERRING DIAG: R Reverse Total Shoulder Surgery  THERAPY DIAG:  Other symptoms and signs involving the musculoskeletal system  Acute pain of right  shoulder  Shoulder stiffness, right  Rationale for Evaluation and Treatment: Rehabilitation  SUBJECTIVE:   SUBJECTIVE STATEMENT: "I'm not feeling too bad" Pt accompanied by: self  PERTINENT HISTORY: Pt fell on 06/10/22 fracturing her humerus and injuring her shoulder. Overall pt has been in an immobilizer ever since.   PRECAUTIONS: Shoulder  WEIGHT BEARING RESTRICTIONS: Yes No lifting  PAIN:  Are you having pain? Yes: NPRS scale: 2/10 Pain location: surgical site Pain description: Uncomfortable Aggravating factors: sling Relieving factors: medication  FALLS: Has patient fallen in last 6 months? Yes. Number of falls 1  PATIENT GOALS: To get use of her arm back  NEXT MD VISIT: 2 weeks  OBJECTIVE:   HAND DOMINANCE: Right  ADLs: Overall ADLs: Pt requiring assist with all ADL's due to being unable to move her arm, she is requring total assist for cleaning and most cooking tasks.   FUNCTIONAL OUTCOME MEASURES: FOTO: 29.81  UPPER EXTREMITY ROM:     Passive ROM In Supine Right eval  Shoulder flexion 113  Shoulder abduction 104  Shoulder internal rotation 90  Shoulder external rotation 49  Elbow flexion 140  Elbow extension -22  (Blank rows = not tested)  UPPER EXTREMITY MMT:     MMT Right eval  Shoulder flexion   Shoulder abduction   Shoulder adduction   Shoulder extension   Shoulder internal rotation   Shoulder external rotation   Elbow flexion   Elbow extension   (  Blank rows = not tested)  SENSATION: WFL  EDEMA: No Swelling Noted  OBSERVATIONS: Severe fasical restrictions along the biceps, trapezius, and axillary region.    TODAY'S TREATMENT:                                                                                                                              DATE:   08/03/22 -manual Therapy: -P/ROM: -Table Slides: -Thumbtacs   PATIENT EDUCATION: Education details: Table Slides Person educated: Patient Education method:  Explanation, Demonstration, and Handouts Education comprehension: verbalized understanding and returned demonstration  HOME EXERCISE PROGRAM: 3/7: Elbow and Wrist ROM, Pendulums 3/11: Table Slides  GOALS: Goals reviewed with patient? Yes  SHORT TERM GOALS: Target date: 08/28/22  Pt will be provided with and educated on HEP to improve mobility in RUE required for use during ADL completion.  Goal status: IN PROGRESS  2.  Pt will increase RUE P/ROM by 40 degrees or greater to improve ability to use RUE during dressing tasks with minimal compensatory techniques.  Goal status: IN PROGRESS  3.  Pt will increase RUE strength to 3+/5 to improve ability to reach for items at waist to chest height during bathing and grooming tasks.  Goal status: IN PROGRESS  LONG TERM GOALS: Target date: 09/25/22  Pt will decrease pain in RUE to 3/10 or less to improve ability to sleep for 2+ consecutive hours without waking due to pain.  Goal status: IN PROGRESS  2.  Pt will decrease RUE fascial restrictions to min amounts or less to improve mobility required for functional reaching tasks. Goal status: IN PROGRESS  3.   Pt will increase RUE A/ROM to at least 145 for flexion/abduction, and 50 degrees er/IR to improve ability to use RUE when reaching overhead or behind back during dressing and bathing tasks.  Goal status: IN PROGRESS  4.  Pt will increase RUE strength to 4+/5 or greater to improve ability to use LUE when lifting or carrying items during meal preparation/housework/yardwork tasks. Goal status: IN PROGRESS  5.  Pt will return to highest level of function using RUE as dominant during functional task completion.  Goal status: IN PROGRESS  ASSESSMENT:  CLINICAL IMPRESSION: Patient is a 72 y.o. female who was seen today for occupational therapy evaluation for s/p R Reverse Total Shoulder Replacement following a fall with humerus fracture. Pt has been in an immobilize since January requiring assist  with all ADL's and IADL's.   PERFORMANCE DEFICITS: in functional skills including ADLs, IADLs, edema, ROM, strength, pain, fascial restrictions, Gross motor control, body mechanics, and UE functional use.   PLAN:  OT FREQUENCY: 2x/week  OT DURATION: 8 weeks  PLANNED INTERVENTIONS: self care/ADL training, therapeutic exercise, therapeutic activity, manual therapy, scar mobilization, passive range of motion, functional mobility training, electrical stimulation, ultrasound, moist heat, cryotherapy, patient/family education, coping strategies training, and DME and/or AE instructions  RECOMMENDED OTHER SERVICES: N/A  CONSULTED AND AGREED WITH PLAN OF CARE:  Patient  PLAN FOR NEXT SESSION: Manual Therapy, P/ROM, thumb tacs, ball rolls, table slides  Paulita Fujita, OTR/L [facility] [place], [place] 08/03/2022, 11:13 AM

## 2022-08-05 ENCOUNTER — Ambulatory Visit (HOSPITAL_COMMUNITY): Payer: Medicare HMO | Admitting: Occupational Therapy

## 2022-08-05 ENCOUNTER — Encounter (HOSPITAL_COMMUNITY): Payer: Self-pay | Admitting: Occupational Therapy

## 2022-08-05 DIAGNOSIS — M25511 Pain in right shoulder: Secondary | ICD-10-CM

## 2022-08-05 DIAGNOSIS — Z96611 Presence of right artificial shoulder joint: Secondary | ICD-10-CM | POA: Diagnosis not present

## 2022-08-05 DIAGNOSIS — M25611 Stiffness of right shoulder, not elsewhere classified: Secondary | ICD-10-CM

## 2022-08-05 DIAGNOSIS — R29898 Other symptoms and signs involving the musculoskeletal system: Secondary | ICD-10-CM

## 2022-08-05 NOTE — Therapy (Signed)
OUTPATIENT OCCUPATIONAL THERAPY ORTHO TREATMENT NOTE  Patient Name: April Murillo MRN: [id number] DOB:05-30-1950, 72 y.o., female Today's Date: 08/05/2022  PCP: Stoney Bang, MD REFERRING PROVIDER: Larena Glassman, MD  END OF SESSION:  OT End of Session - 08/05/22 1432     Visit Number 3    Number of Visits 17    Date for OT Re-Evaluation 10/02/22    Authorization Type [employer]    Progress Note Due on Visit 10    OT Start Time 1350    OT Stop Time 1430    OT Time Calculation (min) 40 min    Activity Tolerance Patient tolerated treatment well    Behavior During Therapy WFL for tasks assessed/performed             Past Medical History:  Diagnosis Date   Arthritis    Diabetes mellitus without complication ([place])    Hypercholesteremia    Hypertension    Past Surgical History:  Procedure Laterality Date   CATARACT EXTRACTION W/PHACO Right 09/18/2019   Procedure: CATARACT EXTRACTION PHACO AND INTRAOCULAR LENS PLACEMENT RIGHT EYE;  Surgeon: Baruch Goldmann, MD;  Location: [facility];  Service: Ophthalmology;  Laterality: Right;  CDE: 6.15   CATARACT EXTRACTION W/PHACO Left 10/02/2019   Procedure: CATARACT EXTRACTION PHACO AND INTRAOCULAR LENS PLACEMENT ([place]) CDE:;  Surgeon: Baruch Goldmann, MD;  Location: [facility];  Service: Ophthalmology;  Laterality: Left;   CLOSED REDUCTION / MANIPULATION JOINT     COLONOSCOPY     REVERSE SHOULDER ARTHROPLASTY Right 06/25/2022   Procedure: REVERSE SHOULDER ARTHROPLASTY;  Surgeon: Mordecai Rasmussen, MD;  Location: [facility];  Service: Orthopedics;  Laterality: Right;   Patient Active Problem List   Diagnosis Date Noted   Closed 4-part fracture of proximal humerus 06/25/2022   Bilateral primary osteoarthritis of knee 02/02/2019   Body mass index 36.0-36.9, adult 02/02/2019    ONSET DATE: 07/13/22  REFERRING DIAG: R Reverse Total Shoulder Surgery  THERAPY DIAG:  Other symptoms and signs involving the musculoskeletal system  Acute pain of right  shoulder  Shoulder stiffness, right  Rationale for Evaluation and Treatment: Rehabilitation  SUBJECTIVE:   SUBJECTIVE STATEMENT: "After I do the table slides, my arm feels a little tight and sore, but otherwise I'm good." Pt accompanied by: self  PERTINENT HISTORY: Pt fell on 06/10/22 fracturing her humerus and injuring her shoulder. Overall pt has been in an immobilizer ever since.   PRECAUTIONS: Shoulder  WEIGHT BEARING RESTRICTIONS: Yes No lifting  PAIN:  Are you having pain? Yes: NPRS scale: 2/10 Pain location: surgical site Pain description: Uncomfortable Aggravating factors: sling Relieving factors: medication  FALLS: Has patient fallen in last 6 months? Yes. Number of falls 1  PATIENT GOALS: To get use of her arm back  NEXT MD VISIT: 2 weeks  OBJECTIVE:   HAND DOMINANCE: Right  ADLs: Overall ADLs: Pt requiring assist with all ADL's due to being unable to move her arm, she is requring total assist for cleaning and most cooking tasks.   FUNCTIONAL OUTCOME MEASURES: FOTO: 29.81  UPPER EXTREMITY ROM:     Passive ROM In Supine Right eval  Shoulder flexion 113  Shoulder abduction 104  Shoulder internal rotation 90  Shoulder external rotation 49  Elbow flexion 140  Elbow extension -22  (Blank rows = not tested)  UPPER EXTREMITY MMT:     MMT Right eval  Shoulder flexion   Shoulder abduction   Shoulder adduction   Shoulder extension   Shoulder internal rotation  Shoulder external rotation   Elbow flexion   Elbow extension   (Blank rows = not tested)  SENSATION: WFL  EDEMA: No Swelling Noted  OBSERVATIONS: Severe fasical restrictions along the biceps, trapezius, and axillary region.    TODAY'S TREATMENT:                                                                                                                              DATE:   08/05/22 -manual Therapy: myofascial release and trigger point massage applied to biceps, trapezius,  scapular region, and axillary region in order to reduce pain and fascial restrictions to improve ROM.  -P/ROM: supine, flexion, abduction, horizontal abduction, er/IR, x10 -Ball Rolls: flexion, abduction, x10 -Wall Wash x60"  08/03/22 -manual Therapy: myofascial release and trigger point massage applied to biceps, trapezius, scapular region, and axillary region in order to reduce pain and fascial restrictions to improve ROM.  -P/ROM: supine, flexion, abduction, horizontal abduction, er/IR, x10 -Table Slides: flexion, abduction, x10 -Thumbtacs: 2x45"   PATIENT EDUCATION: Education details: Table Slides Person educated: Patient Education method: Explanation, Demonstration, and Handouts Education comprehension: verbalized understanding and returned demonstration  HOME EXERCISE PROGRAM: 3/7: Elbow and Wrist ROM, Pendulums 3/11: Table Slides  GOALS: Goals reviewed with patient? Yes  SHORT TERM GOALS: Target date: 08/28/22  Pt will be provided with and educated on HEP to improve mobility in RUE required for use during ADL completion.  Goal status: IN PROGRESS  2.  Pt will increase RUE P/ROM by 40 degrees or greater to improve ability to use RUE during dressing tasks with minimal compensatory techniques.  Goal status: IN PROGRESS  3.  Pt will increase RUE strength to 3+/5 to improve ability to reach for items at waist to chest height during bathing and grooming tasks.  Goal status: IN PROGRESS  LONG TERM GOALS: Target date: 09/25/22  Pt will decrease pain in RUE to 3/10 or less to improve ability to sleep for 2+ consecutive hours without waking due to pain.  Goal status: IN PROGRESS  2.  Pt will decrease RUE fascial restrictions to min amounts or less to improve mobility required for functional reaching tasks. Goal status: IN PROGRESS  3.   Pt will increase RUE A/ROM to at least 145 for flexion/abduction, and 50 degrees er/IR to improve ability to use RUE when reaching overhead or  behind back during dressing and bathing tasks.  Goal status: IN PROGRESS  4.  Pt will increase RUE strength to 4+/5 or greater to improve ability to use LUE when lifting or carrying items during meal preparation/housework/yardwork tasks. Goal status: IN PROGRESS  5.  Pt will return to highest level of function using RUE as dominant during functional task completion.  Goal status: IN PROGRESS  ASSESSMENT:  CLINICAL IMPRESSION: Pt continuing to report improving pain levels and tightness. She is completing her HEP multiple times a day at home and feels like certain ADL's such as donning pull over shirts  and jeans is getting easier. This session continued to follow her protocol, focusing on manual therapy to reduce fascial restrictions, P/ROM to gently stretch pt towards her end ranges, and low level strengthening such as wall washes at elbow height and thumb tacs. Verbal and tactile cuing provided for positioning and technique.   PERFORMANCE DEFICITS: in functional skills including ADLs, IADLs, edema, ROM, strength, pain, fascial restrictions, Gross motor control, body mechanics, and UE functional use.   PLAN:  OT FREQUENCY: 2x/week  OT DURATION: 8 weeks  PLANNED INTERVENTIONS: self care/ADL training, therapeutic exercise, therapeutic activity, manual therapy, scar mobilization, passive range of motion, functional mobility training, electrical stimulation, ultrasound, moist heat, cryotherapy, patient/family education, coping strategies training, and DME and/or AE instructions  RECOMMENDED OTHER SERVICES: N/A  CONSULTED AND AGREED WITH PLAN OF CARE: Patient  PLAN FOR NEXT SESSION: Manual Therapy, P/ROM, thumb tacs, ball rolls, table slides  Paulita Fujita, OTR/L [place], [place] 08/05/2022, 2:33 PM

## 2022-08-10 ENCOUNTER — Ambulatory Visit (HOSPITAL_COMMUNITY): Payer: Medicare HMO | Admitting: Occupational Therapy

## 2022-08-10 ENCOUNTER — Encounter (HOSPITAL_COMMUNITY): Payer: Self-pay | Admitting: Occupational Therapy

## 2022-08-10 DIAGNOSIS — M25611 Stiffness of right shoulder, not elsewhere classified: Secondary | ICD-10-CM | POA: Diagnosis not present

## 2022-08-10 DIAGNOSIS — I10 Essential (primary) hypertension: Secondary | ICD-10-CM | POA: Diagnosis not present

## 2022-08-10 DIAGNOSIS — Z96611 Presence of right artificial shoulder joint: Secondary | ICD-10-CM | POA: Diagnosis not present

## 2022-08-10 DIAGNOSIS — R29898 Other symptoms and signs involving the musculoskeletal system: Secondary | ICD-10-CM | POA: Diagnosis not present

## 2022-08-10 DIAGNOSIS — M25511 Pain in right shoulder: Secondary | ICD-10-CM | POA: Diagnosis not present

## 2022-08-10 DIAGNOSIS — Z6831 Body mass index (BMI) 31.0-31.9, adult: Secondary | ICD-10-CM | POA: Diagnosis not present

## 2022-08-10 DIAGNOSIS — J3081 Allergic rhinitis due to animal (cat) (dog) hair and dander: Secondary | ICD-10-CM | POA: Diagnosis not present

## 2022-08-10 DIAGNOSIS — K219 Gastro-esophageal reflux disease without esophagitis: Secondary | ICD-10-CM | POA: Diagnosis not present

## 2022-08-10 DIAGNOSIS — Z0000 Encounter for general adult medical examination without abnormal findings: Secondary | ICD-10-CM | POA: Diagnosis not present

## 2022-08-10 DIAGNOSIS — E785 Hyperlipidemia, unspecified: Secondary | ICD-10-CM | POA: Diagnosis not present

## 2022-08-10 DIAGNOSIS — E1169 Type 2 diabetes mellitus with other specified complication: Secondary | ICD-10-CM | POA: Diagnosis not present

## 2022-08-10 NOTE — Therapy (Signed)
OUTPATIENT OCCUPATIONAL THERAPY ORTHO TREATMENT NOTE  Patient Name: April Murillo MRN: [id number] DOB:[birth year], 72 y.o., female Today's Date: 08/10/2022  PCP: Stoney Bang, MD REFERRING PROVIDER: Larena Glassman, MD  END OF SESSION:  OT End of Session - 08/10/22 1135     Visit Number 4    Number of Visits 17    Date for OT Re-Evaluation 10/02/22    Authorization Type [employer]    Progress Note Due on Visit 10    OT Start Time 1032    OT Stop Time 1115    OT Time Calculation (min) 43 min    Activity Tolerance Patient tolerated treatment well    Behavior During Therapy WFL for tasks assessed/performed              Past Medical History:  Diagnosis Date   Arthritis    Diabetes mellitus without complication ([place])    Hypercholesteremia    Hypertension    Past Surgical History:  Procedure Laterality Date   CATARACT EXTRACTION W/PHACO Right 09/18/2019   Procedure: CATARACT EXTRACTION PHACO AND INTRAOCULAR LENS PLACEMENT RIGHT EYE;  Surgeon: Baruch Goldmann, MD;  Location: [facility];  Service: Ophthalmology;  Laterality: Right;  CDE: 6.15   CATARACT EXTRACTION W/PHACO Left 10/02/2019   Procedure: CATARACT EXTRACTION PHACO AND INTRAOCULAR LENS PLACEMENT ([place]) CDE:;  Surgeon: Baruch Goldmann, MD;  Location: [facility];  Service: Ophthalmology;  Laterality: Left;   CLOSED REDUCTION / MANIPULATION JOINT     COLONOSCOPY     REVERSE SHOULDER ARTHROPLASTY Right 06/25/2022   Procedure: REVERSE SHOULDER ARTHROPLASTY;  Surgeon: Mordecai Rasmussen, MD;  Location: [facility];  Service: Orthopedics;  Laterality: Right;   Patient Active Problem List   Diagnosis Date Noted   Closed 4-part fracture of proximal humerus 06/25/2022   Bilateral primary osteoarthritis of knee 02/02/2019   Body mass index 36.0-36.9, adult 02/02/2019    ONSET DATE: 07/13/22  REFERRING DIAG: R Reverse Total Shoulder Surgery  THERAPY DIAG:  Other symptoms and signs involving the musculoskeletal system  Acute pain of  right shoulder  Shoulder stiffness, right  Rationale for Evaluation and Treatment: Rehabilitation  SUBJECTIVE:   SUBJECTIVE STATEMENT: S: I haven't had pain in a long time, just soreness.   PERTINENT HISTORY: Pt fell on 06/10/22 fracturing her humerus and injuring her shoulder. Overall pt has been in an immobilizer ever since.   PRECAUTIONS: Shoulder-see protocol  WEIGHT BEARING RESTRICTIONS: Yes No lifting  PAIN:  Are you having pain? Yes: NPRS scale: 4/10 Pain location: surgical site Pain description: Soreness Aggravating factors: sling Relieving factors: medication  FALLS: Has patient fallen in last 6 months? Yes. Number of falls 1  PATIENT GOALS: To get use of her arm back  NEXT MD VISIT: 2 weeks  OBJECTIVE:   HAND DOMINANCE: Right  ADLs: Overall ADLs: Pt requiring assist with all ADL's due to being unable to move her arm, she is requring total assist for cleaning and most cooking tasks.   FUNCTIONAL OUTCOME MEASURES: FOTO: 29.81  UPPER EXTREMITY ROM:     Passive ROM In Supine Right eval  Shoulder flexion 113  Shoulder abduction 104  Shoulder internal rotation 90  Shoulder external rotation 49  Elbow flexion 140  Elbow extension -22  (Blank rows = not tested)  UPPER EXTREMITY MMT:     MMT Right eval  Shoulder flexion   Shoulder abduction   Shoulder adduction   Shoulder extension   Shoulder internal rotation   Shoulder external rotation   Elbow  flexion   Elbow extension   (Blank rows = not tested)  SENSATION: WFL  EDEMA: No Swelling Noted  OBSERVATIONS: Severe fasical restrictions along the biceps, trapezius, and axillary region.    TODAY'S TREATMENT:                                                                                                                              DATE:  08/10/22 -manual Therapy: myofascial release and trigger point massage applied to biceps, trapezius, scapular region, and axillary region in order to reduce  pain and fascial restrictions to improve ROM.  -P/ROM: supine, flexion, abduction, horizontal abduction, er/IR, x5 -Isometrics: supine-flexion, extension, abduction, er/IR, 3x5" -AA/ROM: supine-protraction, flexion, er/IR, horizontal abduction, 5 reps -Thumb tacks: low level 1' -Pulleys: 1' flexion, 1' abduction  08/05/22 -manual Therapy: myofascial release and trigger point massage applied to biceps, trapezius, scapular region, and axillary region in order to reduce pain and fascial restrictions to improve ROM.  -P/ROM: supine, flexion, abduction, horizontal abduction, er/IR, x10 -Ball Rolls: flexion, abduction, x10 -Wall Wash x60"  08/03/22 -manual Therapy: myofascial release and trigger point massage applied to biceps, trapezius, scapular region, and axillary region in order to reduce pain and fascial restrictions to improve ROM.  -P/ROM: supine, flexion, abduction, horizontal abduction, er/IR, x10 -Table Slides: flexion, abduction, x10 -Thumbtacs: 2x45"   PATIENT EDUCATION: Education details: reviewed HEP Person educated: Patient Education method: Explanation, Demonstration, and Handouts Education comprehension: verbalized understanding and returned demonstration  HOME EXERCISE PROGRAM: 3/7: Elbow and Wrist ROM, Pendulums 3/11: Table Slides  GOALS: Goals reviewed with patient? Yes  SHORT TERM GOALS: Target date: 08/28/22  Pt will be provided with and educated on HEP to improve mobility in RUE required for use during ADL completion.  Goal status: IN PROGRESS  2.  Pt will increase RUE P/ROM by 40 degrees or greater to improve ability to use RUE during dressing tasks with minimal compensatory techniques.  Goal status: IN PROGRESS  3.  Pt will increase RUE strength to 3+/5 to improve ability to reach for items at waist to chest height during bathing and grooming tasks.  Goal status: IN PROGRESS  LONG TERM GOALS: Target date: 09/25/22  Pt will decrease pain in RUE to 3/10 or  less to improve ability to sleep for 2+ consecutive hours without waking due to pain.  Goal status: IN PROGRESS  2.  Pt will decrease RUE fascial restrictions to min amounts or less to improve mobility required for functional reaching tasks. Goal status: IN PROGRESS  3.   Pt will increase RUE A/ROM to at least 145 for flexion/abduction, and 50 degrees er/IR to improve ability to use RUE when reaching overhead or behind back during dressing and bathing tasks.  Goal status: IN PROGRESS  4.  Pt will increase RUE strength to 4+/5 or greater to improve ability to use LUE when lifting or carrying items during meal preparation/housework/yardwork tasks. Goal status: IN PROGRESS  5.  Pt  will return to highest level of function using RUE as dominant during functional task completion.  Goal status: IN PROGRESS  ASSESSMENT:  CLINICAL IMPRESSION: Pt reports she is completing her HEP, wears sling sometimes. Continued with manual techniques and passive stretching. Pt is now 4 weeks out from surgery, therefore progressed to phase II of protocol and added AA/ROM in supine. Also added isometrics and thumb tacks today. Pt with mod difficulty initiating AA/ROM, however once she started demonstrated greater ease with each rep. Verbal cuing for form and technique.   PERFORMANCE DEFICITS: in functional skills including ADLs, IADLs, edema, ROM, strength, pain, fascial restrictions, Gross motor control, body mechanics, and UE functional use.   PLAN:  OT FREQUENCY: 2x/week  OT DURATION: 8 weeks  PLANNED INTERVENTIONS: self care/ADL training, therapeutic exercise, therapeutic activity, manual therapy, scar mobilization, passive range of motion, functional mobility training, electrical stimulation, ultrasound, moist heat, cryotherapy, patient/family education, coping strategies training, and DME and/or AE instructions  CONSULTED AND AGREED WITH PLAN OF CARE: Patient  PLAN FOR NEXT SESSION: Manual Therapy,  AA/ROM, pulleys, add low level wall wash   Guadelupe Sabin, OTR/L  [phone] 08/10/2022, 11:36 AM

## 2022-08-11 ENCOUNTER — Other Ambulatory Visit (INDEPENDENT_AMBULATORY_CARE_PROVIDER_SITE_OTHER): Payer: Medicare HMO

## 2022-08-11 ENCOUNTER — Other Ambulatory Visit: Payer: Self-pay | Admitting: Orthopaedic Surgery

## 2022-08-11 ENCOUNTER — Encounter: Payer: Self-pay | Admitting: Orthopedic Surgery

## 2022-08-11 ENCOUNTER — Ambulatory Visit: Payer: Medicare HMO | Admitting: Orthopedic Surgery

## 2022-08-11 DIAGNOSIS — Z96611 Presence of right artificial shoulder joint: Secondary | ICD-10-CM

## 2022-08-11 NOTE — Patient Instructions (Signed)
Keep working with therapy  Do not need to continue using the sling

## 2022-08-11 NOTE — Progress Notes (Signed)
Orthopaedic Postop Note  Assessment: April Murillo is a 72 y.o. female s/p Right Reverse Shoulder Arthroplasty for a Fracture  DOS: 06/25/2022  Plan: April Murillo is doing well at this time.  Limited pain.  She has started workup physical therapy.  She has noticed some tightness around her shoulder.  On physical exam, she has gotten stiff, and this is expected following the surgery.  I urged her to continue working on her range of motion.  She no longer needs to use her sling.  Medications as needed.  I will see her back in approximately 6 weeks.   Follow-up: Return in about 6 weeks (around 09/22/2022).  XR at next visit: Right shoulder  Subjective:  Chief Complaint  Patient presents with   Routine Post Op    R RSA DOS: 06/25/2022    History of Present Illness: April Murillo is a 72 y.o. female who presents following the above stated procedure.  Surgery was approximately 6 weeks ago.  Pain is controlled.  She is working with physical therapy.  Gradual increase in her motion.  She notes that the shoulder remains tight.  She denies numbness and tingling.     Review of Systems: No fevers or chills No numbness or tingling No Chest Pain No shortness of breath   Objective: There were no vitals taken for this visit.  Physical Exam:  Alert and oriented.  No acute distress.  Surgical incision has healed.  No surrounding erythema or drainage.  No tenderness to palpation.  Passive forward flexion to 90 degrees.  Passive abduction to 80 degrees.  Passive external rotation to 30 degrees.  Fingers warm and well-perfused.  Sensation intact throughout the right upper extremity.  IMAGING: I personally ordered and reviewed the following images:  X-rays of the right shoulder were obtained in clinic today.  These are compared to prior x-rays.  Reverse shoulder prosthesis remains in good position.  No evidence of subsidence.  No new injuries.  No prior periprosthetic lucency.  Glenohumeral  joint is reduced.  Tuberosities remain in unchanged position.  Impression: Right reverse shoulder arthroplasty in stable position, without evidence of hardware failure     Mordecai Rasmussen, MD 08/11/2022 1:35 PM

## 2022-08-12 ENCOUNTER — Ambulatory Visit (HOSPITAL_COMMUNITY): Payer: Medicare HMO | Admitting: Occupational Therapy

## 2022-08-12 ENCOUNTER — Encounter (HOSPITAL_COMMUNITY): Payer: Self-pay | Admitting: Occupational Therapy

## 2022-08-12 DIAGNOSIS — M25611 Stiffness of right shoulder, not elsewhere classified: Secondary | ICD-10-CM | POA: Diagnosis not present

## 2022-08-12 DIAGNOSIS — R29898 Other symptoms and signs involving the musculoskeletal system: Secondary | ICD-10-CM

## 2022-08-12 DIAGNOSIS — Z96611 Presence of right artificial shoulder joint: Secondary | ICD-10-CM | POA: Diagnosis not present

## 2022-08-12 DIAGNOSIS — M25511 Pain in right shoulder: Secondary | ICD-10-CM | POA: Diagnosis not present

## 2022-08-12 NOTE — Patient Instructions (Signed)

## 2022-08-12 NOTE — Therapy (Signed)
OUTPATIENT OCCUPATIONAL THERAPY ORTHO TREATMENT NOTE  Patient Name: CORRETTA SALA MRN: [id number] DOB:08-25-1950, 72 y.o., female Today's Date: 08/12/2022  PCP: Stoney Bang, MD REFERRING PROVIDER: Larena Glassman, MD  END OF SESSION:  OT End of Session - 08/12/22 1020     Visit Number 5    Number of Visits 17    Date for OT Re-Evaluation 10/02/22    Authorization Type [employer]    Progress Note Due on Visit 10    OT Start Time 0950    OT Stop Time 1030    OT Time Calculation (min) 40 min    Activity Tolerance Patient tolerated treatment well    Behavior During Therapy WFL for tasks assessed/performed              Past Medical History:  Diagnosis Date   Arthritis    Diabetes mellitus without complication ([place])    Hypercholesteremia    Hypertension    Past Surgical History:  Procedure Laterality Date   CATARACT EXTRACTION W/PHACO Right 09/18/2019   Procedure: CATARACT EXTRACTION PHACO AND INTRAOCULAR LENS PLACEMENT RIGHT EYE;  Surgeon: Baruch Goldmann, MD;  Location: [facility];  Service: Ophthalmology;  Laterality: Right;  CDE: 6.15   CATARACT EXTRACTION W/PHACO Left 10/02/2019   Procedure: CATARACT EXTRACTION PHACO AND INTRAOCULAR LENS PLACEMENT ([place]) CDE:;  Surgeon: Baruch Goldmann, MD;  Location: [facility];  Service: Ophthalmology;  Laterality: Left;   CLOSED REDUCTION / MANIPULATION JOINT     COLONOSCOPY     REVERSE SHOULDER ARTHROPLASTY Right 06/25/2022   Procedure: REVERSE SHOULDER ARTHROPLASTY;  Surgeon: Mordecai Rasmussen, MD;  Location: [facility];  Service: Orthopedics;  Laterality: Right;   Patient Active Problem List   Diagnosis Date Noted   Closed 4-part fracture of proximal humerus 06/25/2022   Bilateral primary osteoarthritis of knee 02/02/2019   Body mass index 36.0-36.9, adult 02/02/2019   Blood in stool, frank 12/19/2018    ONSET DATE: 07/13/22  REFERRING DIAG: R Reverse Total Shoulder Surgery  THERAPY DIAG:  Other symptoms and signs involving the  musculoskeletal system  Acute pain of right shoulder  Shoulder stiffness, right  Rationale for Evaluation and Treatment: Rehabilitation  SUBJECTIVE:   SUBJECTIVE STATEMENT: S: I haven't had pain in a long time, just soreness.   PERTINENT HISTORY: Pt fell on 06/10/22 fracturing her humerus and injuring her shoulder. Overall pt has been in an immobilizer ever since.   PRECAUTIONS: Shoulder-see protocol  WEIGHT BEARING RESTRICTIONS: Yes No lifting  PAIN:  Are you having pain? Yes: NPRS scale: 4/10 Pain location: surgical site Pain description: Soreness Aggravating factors: sling Relieving factors: medication  FALLS: Has patient fallen in last 6 months? Yes. Number of falls 1  PATIENT GOALS: To get use of her arm back  NEXT MD VISIT: 6 weeks  OBJECTIVE:   HAND DOMINANCE: Right  ADLs: Overall ADLs: Pt requiring assist with all ADL's due to being unable to move her arm, she is requring total assist for cleaning and most cooking tasks.   FUNCTIONAL OUTCOME MEASURES: FOTO: 29.81  UPPER EXTREMITY ROM:     Passive ROM In Supine Right eval  Shoulder flexion 113  Shoulder abduction 104  Shoulder internal rotation 90  Shoulder external rotation 49  Elbow flexion 140  Elbow extension -22  (Blank rows = not tested)  UPPER EXTREMITY MMT:     MMT Right eval  Shoulder flexion   Shoulder abduction   Shoulder adduction   Shoulder extension   Shoulder internal rotation  Shoulder external rotation   Elbow flexion   Elbow extension   (Blank rows = not tested)  SENSATION: WFL  EDEMA: No Swelling Noted  OBSERVATIONS: Severe fasical restrictions along the biceps, trapezius, and axillary region.    TODAY'S TREATMENT:                                                                                                                              DATE:  08/12/22 -manual Therapy: myofascial release and trigger point massage applied to biceps, trapezius, scapular region,  and axillary region in order to reduce pain and fascial restrictions to improve ROM.  -P/ROM: supine, flexion, abduction, horizontal abduction, er/IR, x5 -AA/ROM: supine-protraction, flexion, er/IR, horizontal abduction, abduction (mod assist), 10 reps -Wall Slides: flexion, x10  08/10/22 -manual Therapy: myofascial release and trigger point massage applied to biceps, trapezius, scapular region, and axillary region in order to reduce pain and fascial restrictions to improve ROM.  -P/ROM: supine, flexion, abduction, horizontal abduction, er/IR, x5 -Isometrics: supine-flexion, extension, abduction, er/IR, 3x5" -AA/ROM: supine-protraction, flexion, er/IR, horizontal abduction, 5 reps -Thumb tacks: low level 1' -Pulleys: 1' flexion, 1' abduction  08/05/22 -manual Therapy: myofascial release and trigger point massage applied to biceps, trapezius, scapular region, and axillary region in order to reduce pain and fascial restrictions to improve ROM.  -P/ROM: supine, flexion, abduction, horizontal abduction, er/IR, x10 -Ball Rolls: flexion, abduction, x10 -Wall Wash x60"   PATIENT EDUCATION: Education details: AA/ROM Person educated: Patient Education method: Consulting civil engineer, Media planner, and Handouts Education comprehension: verbalized understanding and returned demonstration  HOME EXERCISE PROGRAM: 3/7: Elbow and Wrist ROM, Pendulums 3/11: Table Slides 3/20: AA/ROM  GOALS: Goals reviewed with patient? Yes  SHORT TERM GOALS: Target date: 08/28/22  Pt will be provided with and educated on HEP to improve mobility in RUE required for use during ADL completion.  Goal status: IN PROGRESS  2.  Pt will increase RUE P/ROM by 40 degrees or greater to improve ability to use RUE during dressing tasks with minimal compensatory techniques.  Goal status: IN PROGRESS  3.  Pt will increase RUE strength to 3+/5 to improve ability to reach for items at waist to chest height during bathing and grooming  tasks.  Goal status: IN PROGRESS  LONG TERM GOALS: Target date: 09/25/22  Pt will decrease pain in RUE to 3/10 or less to improve ability to sleep for 2+ consecutive hours without waking due to pain.  Goal status: IN PROGRESS  2.  Pt will decrease RUE fascial restrictions to min amounts or less to improve mobility required for functional reaching tasks. Goal status: IN PROGRESS  3.   Pt will increase RUE A/ROM to at least 145 for flexion/abduction, and 50 degrees er/IR to improve ability to use RUE when reaching overhead or behind back during dressing and bathing tasks.  Goal status: IN PROGRESS  4.  Pt will increase RUE strength to 4+/5 or greater to improve ability to use LUE when lifting  or carrying items during meal preparation/housework/yardwork tasks. Goal status: IN PROGRESS  5.  Pt will return to highest level of function using RUE as dominant during functional task completion.  Goal status: IN PROGRESS  ASSESSMENT:  CLINICAL IMPRESSION: Pt reports that she is feeling better now that she isn't wearing the sling. She continues to have increased tension and fascial restrictions along the biceps, trapezius, scapularis, and axillary region, addressed with manual therapy. She continued to work on AA/ROM this session, increasing the repetitions to 10, however due to pain and restrictions she was unable to complete abduction. OT providing verbal and tactile cuing for positioning and technique throughout session.   PERFORMANCE DEFICITS: in functional skills including ADLs, IADLs, edema, ROM, strength, pain, fascial restrictions, Gross motor control, body mechanics, and UE functional use.   PLAN:  OT FREQUENCY: 2x/week  OT DURATION: 8 weeks  PLANNED INTERVENTIONS: self care/ADL training, therapeutic exercise, therapeutic activity, manual therapy, scar mobilization, passive range of motion, functional mobility training, electrical stimulation, ultrasound, moist heat, cryotherapy,  patient/family education, coping strategies training, and DME and/or AE instructions  CONSULTED AND AGREED WITH PLAN OF CARE: Patient  PLAN FOR NEXT SESSION: Manual Therapy, AA/ROM, pulleys, add low level wall wash   Paulita Fujita, OTR/L [phone] 08/12/2022, 10:25 AM

## 2022-08-17 ENCOUNTER — Ambulatory Visit (HOSPITAL_COMMUNITY): Payer: Medicare HMO | Admitting: Occupational Therapy

## 2022-08-17 ENCOUNTER — Encounter (HOSPITAL_COMMUNITY): Payer: Self-pay | Admitting: Occupational Therapy

## 2022-08-17 DIAGNOSIS — R29898 Other symptoms and signs involving the musculoskeletal system: Secondary | ICD-10-CM

## 2022-08-17 DIAGNOSIS — Z96611 Presence of right artificial shoulder joint: Secondary | ICD-10-CM | POA: Diagnosis not present

## 2022-08-17 DIAGNOSIS — M25611 Stiffness of right shoulder, not elsewhere classified: Secondary | ICD-10-CM

## 2022-08-17 DIAGNOSIS — M25511 Pain in right shoulder: Secondary | ICD-10-CM | POA: Diagnosis not present

## 2022-08-17 NOTE — Therapy (Signed)
OUTPATIENT OCCUPATIONAL THERAPY ORTHO TREATMENT NOTE  Patient Name: April Murillo MRN: [id number] DOB:[birth year], 72 y.o., female Today's Date: 08/17/2022  PCP: Stoney Bang, MD REFERRING PROVIDER: Larena Glassman, MD  END OF SESSION:  OT End of Session - 08/17/22 1115     Visit Number 6    Number of Visits 17    Date for OT Re-Evaluation 10/02/22    Authorization Type [employer]    Progress Note Due on Visit 10    OT Start Time 1035    OT Stop Time 1113    OT Time Calculation (min) 38 min    Activity Tolerance Patient tolerated treatment well    Behavior During Therapy WFL for tasks assessed/performed               Past Medical History:  Diagnosis Date   Arthritis    Diabetes mellitus without complication ([place])    Hypercholesteremia    Hypertension    Past Surgical History:  Procedure Laterality Date   CATARACT EXTRACTION W/PHACO Right 09/18/2019   Procedure: CATARACT EXTRACTION PHACO AND INTRAOCULAR LENS PLACEMENT RIGHT EYE;  Surgeon: Baruch Goldmann, MD;  Location: [facility];  Service: Ophthalmology;  Laterality: Right;  CDE: 6.15   CATARACT EXTRACTION W/PHACO Left 10/02/2019   Procedure: CATARACT EXTRACTION PHACO AND INTRAOCULAR LENS PLACEMENT ([place]) CDE:;  Surgeon: Baruch Goldmann, MD;  Location: [facility];  Service: Ophthalmology;  Laterality: Left;   CLOSED REDUCTION / MANIPULATION JOINT     COLONOSCOPY     REVERSE SHOULDER ARTHROPLASTY Right 06/25/2022   Procedure: REVERSE SHOULDER ARTHROPLASTY;  Surgeon: Mordecai Rasmussen, MD;  Location: [facility];  Service: Orthopedics;  Laterality: Right;   Patient Active Problem List   Diagnosis Date Noted   Closed 4-part fracture of proximal humerus 06/25/2022   Bilateral primary osteoarthritis of knee 02/02/2019   Body mass index 36.0-36.9, adult 02/02/2019   Blood in stool, frank 12/19/2018    ONSET DATE: 07/13/22  REFERRING DIAG: R Reverse Total Shoulder Surgery  THERAPY DIAG:  Other symptoms and signs involving the  musculoskeletal system  Acute pain of right shoulder  Shoulder stiffness, right  Rationale for Evaluation and Treatment: Rehabilitation  SUBJECTIVE:   SUBJECTIVE STATEMENT: S: I've been trying those exercises and they're hard.   PERTINENT HISTORY: Pt fell on 06/10/22 fracturing her humerus and injuring her shoulder. Overall pt has been in an immobilizer ever since.   PRECAUTIONS: Shoulder-see protocol  WEIGHT BEARING RESTRICTIONS: Yes No lifting  PAIN:  Are you having pain? Yes: NPRS scale: 3/10 Pain location: right shoulder Pain description: Soreness Aggravating factors: sling Relieving factors: medication  FALLS: Has patient fallen in last 6 months? Yes. Number of falls 1  PATIENT GOALS: To get use of her arm back  NEXT MD VISIT: 6 weeks  OBJECTIVE:   HAND DOMINANCE: Right  ADLs: Overall ADLs: Pt requiring assist with all ADL's due to being unable to move her arm, she is requring total assist for cleaning and most cooking tasks.   FUNCTIONAL OUTCOME MEASURES: FOTO: 29.81  UPPER EXTREMITY ROM:     Passive ROM In Supine Right eval  Shoulder flexion 113  Shoulder abduction 104  Shoulder internal rotation 90  Shoulder external rotation 49  Elbow flexion 140  Elbow extension -22  (Blank rows = not tested)  UPPER EXTREMITY MMT:     MMT Right eval  Shoulder flexion   Shoulder abduction   Shoulder adduction   Shoulder extension   Shoulder internal rotation  Shoulder external rotation   Elbow flexion   Elbow extension   (Blank rows = not tested)  OBSERVATIONS: Severe fasical restrictions along the biceps, trapezius, and axillary region.    TODAY'S TREATMENT:                                                                                                                              DATE:  08/17/22 -manual Therapy: myofascial release and trigger point massage applied to biceps, trapezius, scapular region, and axillary region in order to reduce pain and  fascial restrictions to improve ROM.  -P/ROM: supine, flexion, abduction, horizontal abduction, er/IR, x5 -AA/ROM: supine-protraction, flexion, er/IR, horizontal abduction, abduction (mod assist), 10 reps -Wall wash: 1' low level -Pulleys: 2' flexion, 2' abduction  08/12/22 -manual Therapy: myofascial release and trigger point massage applied to biceps, trapezius, scapular region, and axillary region in order to reduce pain and fascial restrictions to improve ROM.  -P/ROM: supine, flexion, abduction, horizontal abduction, er/IR, x5 -AA/ROM: supine-protraction, flexion, er/IR, horizontal abduction, abduction (mod assist), 10 reps -Wall Slides: flexion, x10  08/10/22 -manual Therapy: myofascial release and trigger point massage applied to biceps, trapezius, scapular region, and axillary region in order to reduce pain and fascial restrictions to improve ROM.  -P/ROM: supine, flexion, abduction, horizontal abduction, er/IR, x5 -Isometrics: supine-flexion, extension, abduction, er/IR, 3x5" -AA/ROM: supine-protraction, flexion, er/IR, horizontal abduction, 5 reps -Thumb tacks: low level 1' -Pulleys: 1' flexion, 1' abduction    PATIENT EDUCATION: Education details: suggested completing in supine versus standing Person educated: Patient Education method: Explanation, Demonstration, and Handouts Education comprehension: verbalized understanding and returned demonstration  HOME EXERCISE PROGRAM: 3/7: Elbow and Wrist ROM, Pendulums 3/11: Table Slides 3/20: AA/ROM  GOALS: Goals reviewed with patient? Yes  SHORT TERM GOALS: Target date: 08/28/22  Pt will be provided with and educated on HEP to improve mobility in RUE required for use during ADL completion.  Goal status: IN PROGRESS  2.  Pt will increase RUE P/ROM by 40 degrees or greater to improve ability to use RUE during dressing tasks with minimal compensatory techniques.  Goal status: IN PROGRESS  3.  Pt will increase RUE strength to  3+/5 to improve ability to reach for items at waist to chest height during bathing and grooming tasks.  Goal status: IN PROGRESS  LONG TERM GOALS: Target date: 09/25/22  Pt will decrease pain in RUE to 3/10 or less to improve ability to sleep for 2+ consecutive hours without waking due to pain.  Goal status: IN PROGRESS  2.  Pt will decrease RUE fascial restrictions to min amounts or less to improve mobility required for functional reaching tasks. Goal status: IN PROGRESS  3.   Pt will increase RUE A/ROM to at least 145 for flexion/abduction, and 50 degrees er/IR to improve ability to use RUE when reaching overhead or behind back during dressing and bathing tasks.  Goal status: IN PROGRESS  4.  Pt will increase RUE strength to  4+/5 or greater to improve ability to use LUE when lifting or carrying items during meal preparation/housework/yardwork tasks. Goal status: IN PROGRESS  5.  Pt will return to highest level of function using RUE as dominant during functional task completion.  Goal status: IN PROGRESS  ASSESSMENT:  CLINICAL IMPRESSION: Pt reports that she is completing her HEP, but it is difficulty. Recommended pt modify to supine position versus standing to reduce gravity. Continued with manual techniques and passive stretching, AA/ROM in supine. Pt completing wall wash and pulleys, achieving flexion and abduction to approximately 100-110 degrees. Verbal cuing for form and technique throughout tasks.   PERFORMANCE DEFICITS: in functional skills including ADLs, IADLs, edema, ROM, strength, pain, fascial restrictions, Gross motor control, body mechanics, and UE functional use.   PLAN:  OT FREQUENCY: 2x/week  OT DURATION: 8 weeks  PLANNED INTERVENTIONS: self care/ADL training, therapeutic exercise, therapeutic activity, manual therapy, scar mobilization, passive range of motion, functional mobility training, electrical stimulation, ultrasound, moist heat, cryotherapy, patient/family  education, coping strategies training, and DME and/or AE instructions  CONSULTED AND AGREED WITH PLAN OF CARE: Patient  PLAN FOR NEXT SESSION: Manual Therapy, AA/ROM, pulleys, add low level wall wash   Guadelupe Sabin, OTR/L  [phone] 08/17/2022, 11:15 AM

## 2022-08-19 ENCOUNTER — Ambulatory Visit (HOSPITAL_COMMUNITY): Payer: Medicare HMO | Admitting: Occupational Therapy

## 2022-08-19 ENCOUNTER — Encounter (HOSPITAL_COMMUNITY): Payer: Self-pay | Admitting: Occupational Therapy

## 2022-08-19 DIAGNOSIS — M25511 Pain in right shoulder: Secondary | ICD-10-CM | POA: Diagnosis not present

## 2022-08-19 DIAGNOSIS — M25611 Stiffness of right shoulder, not elsewhere classified: Secondary | ICD-10-CM | POA: Diagnosis not present

## 2022-08-19 DIAGNOSIS — Z96611 Presence of right artificial shoulder joint: Secondary | ICD-10-CM | POA: Diagnosis not present

## 2022-08-19 DIAGNOSIS — R29898 Other symptoms and signs involving the musculoskeletal system: Secondary | ICD-10-CM | POA: Diagnosis not present

## 2022-08-19 NOTE — Therapy (Signed)
OUTPATIENT OCCUPATIONAL THERAPY ORTHO TREATMENT NOTE  Patient Name: April Murillo MRN: [id number] DOB:Jun 16, 1950, 72 y.o., female Today's Date: 08/19/2022  PCP: Stoney Bang, MD REFERRING PROVIDER: Larena Glassman, MD  END OF SESSION:  OT End of Session - 08/19/22 1124     Visit Number 7    Number of Visits 17    Date for OT Re-Evaluation 10/02/22    Authorization Type [employer]    Progress Note Due on Visit 10    OT Start Time 1034    OT Stop Time 1115    OT Time Calculation (min) 41 min    Activity Tolerance Patient tolerated treatment well    Behavior During Therapy WFL for tasks assessed/performed                Past Medical History:  Diagnosis Date   Arthritis    Diabetes mellitus without complication ([place])    Hypercholesteremia    Hypertension    Past Surgical History:  Procedure Laterality Date   CATARACT EXTRACTION W/PHACO Right 09/18/2019   Procedure: CATARACT EXTRACTION PHACO AND INTRAOCULAR LENS PLACEMENT RIGHT EYE;  Surgeon: Baruch Goldmann, MD;  Location: [facility];  Service: Ophthalmology;  Laterality: Right;  CDE: 6.15   CATARACT EXTRACTION W/PHACO Left 10/02/2019   Procedure: CATARACT EXTRACTION PHACO AND INTRAOCULAR LENS PLACEMENT ([place]) CDE:;  Surgeon: Baruch Goldmann, MD;  Location: [facility];  Service: Ophthalmology;  Laterality: Left;   CLOSED REDUCTION / MANIPULATION JOINT     COLONOSCOPY     REVERSE SHOULDER ARTHROPLASTY Right 06/25/2022   Procedure: REVERSE SHOULDER ARTHROPLASTY;  Surgeon: Mordecai Rasmussen, MD;  Location: [facility];  Service: Orthopedics;  Laterality: Right;   Patient Active Problem List   Diagnosis Date Noted   Closed 4-part fracture of proximal humerus 06/25/2022   Bilateral primary osteoarthritis of knee 02/02/2019   Body mass index 36.0-36.9, adult 02/02/2019   Blood in stool, frank 12/19/2018    ONSET DATE: 07/13/22  REFERRING DIAG: R Reverse Total Shoulder Surgery  THERAPY DIAG:  Other symptoms and signs involving the  musculoskeletal system  Acute pain of right shoulder  Shoulder stiffness, right  Rationale for Evaluation and Treatment: Rehabilitation  SUBJECTIVE:   SUBJECTIVE STATEMENT: S: Those exercises are tough.   PERTINENT HISTORY: Pt fell on 06/10/22 fracturing her humerus and injuring her shoulder. Overall pt has been in an immobilizer ever since.   PRECAUTIONS: Shoulder-see protocol  WEIGHT BEARING RESTRICTIONS: Yes No lifting  PAIN:  Are you having pain? Yes: NPRS scale: 3/10 Pain location: right shoulder Pain description: Soreness Aggravating factors: sling Relieving factors: medication  FALLS: Has patient fallen in last 6 months? Yes. Number of falls 1  PATIENT GOALS: To get use of her arm back  NEXT MD VISIT: 6 weeks  OBJECTIVE:   HAND DOMINANCE: Right  ADLs: Overall ADLs: Pt requiring assist with all ADL's due to being unable to move her arm, she is requring total assist for cleaning and most cooking tasks.   FUNCTIONAL OUTCOME MEASURES: FOTO: 29.81  UPPER EXTREMITY ROM:       Assessed supine, er/IR adducted  Passive ROM In Supine Right eval  Shoulder flexion 113  Shoulder abduction 104  Shoulder internal rotation 90  Shoulder external rotation 49  Elbow flexion 140  Elbow extension -22  (Blank rows = not tested)  UPPER EXTREMITY MMT:     MMT Right eval  Shoulder flexion   Shoulder abduction   Shoulder internal rotation   Shoulder external rotation   (  Blank rows = not tested)  OBSERVATIONS: Severe fasical restrictions along the biceps, trapezius, and axillary region.    TODAY'S TREATMENT:                                                                                                                              DATE:  08/19/22 -manual Therapy: myofascial release and trigger point massage applied to biceps, trapezius, scapular region, and axillary region in order to reduce pain and fascial restrictions to improve ROM.  -P/ROM: supine, flexion,  abduction, horizontal abduction, er/IR, x5 -AA/ROM: supine-protraction, flexion, er/IR, horizontal abduction, 10 reps -AA/ROM: standing-abduction, er/IR, 10 reps -Wall wash: 1' low level -Pulleys: 1' flexion, 1' abduction  08/17/22 -manual Therapy: myofascial release and trigger point massage applied to biceps, trapezius, scapular region, and axillary region in order to reduce pain and fascial restrictions to improve ROM.  -P/ROM: supine, flexion, abduction, horizontal abduction, er/IR, x5 -AA/ROM: supine-protraction, flexion, er/IR, horizontal abduction, abduction (mod assist), 10 reps -Wall wash: 1' low level -Pulleys: 2' flexion, 2' abduction  08/12/22 -manual Therapy: myofascial release and trigger point massage applied to biceps, trapezius, scapular region, and axillary region in order to reduce pain and fascial restrictions to improve ROM.  -P/ROM: supine, flexion, abduction, horizontal abduction, er/IR, x5 -AA/ROM: supine-protraction, flexion, er/IR, horizontal abduction, abduction (mod assist), 10 reps -Wall Slides: flexion, x10     PATIENT EDUCATION: Education details: reviewed HEP Person educated: Patient Education method: Consulting civil engineer, Demonstration, and Handouts Education comprehension: verbalized understanding and returned demonstration  HOME EXERCISE PROGRAM: 3/7: Elbow and Wrist ROM, Pendulums 3/11: Table Slides 3/20: AA/ROM  GOALS: Goals reviewed with patient? Yes  SHORT TERM GOALS: Target date: 08/28/22  Pt will be provided with and educated on HEP to improve mobility in RUE required for use during ADL completion.  Goal status: IN PROGRESS  2.  Pt will increase RUE P/ROM by 40 degrees or greater to improve ability to use RUE during dressing tasks with minimal compensatory techniques.  Goal status: IN PROGRESS  3.  Pt will increase RUE strength to 3+/5 to improve ability to reach for items at waist to chest height during bathing and grooming tasks.  Goal  status: IN PROGRESS  LONG TERM GOALS: Target date: 09/25/22  Pt will decrease pain in RUE to 3/10 or less to improve ability to sleep for 2+ consecutive hours without waking due to pain.  Goal status: IN PROGRESS  2.  Pt will decrease RUE fascial restrictions to min amounts or less to improve mobility required for functional reaching tasks. Goal status: IN PROGRESS  3.   Pt will increase RUE A/ROM to at least 145 for flexion/abduction, and 50 degrees er/IR to improve ability to use RUE when reaching overhead or behind back during dressing and bathing tasks.  Goal status: IN PROGRESS  4.  Pt will increase RUE strength to 4+/5 or greater to improve ability to use LUE when lifting or carrying items during meal preparation/housework/yardwork  tasks. Goal status: IN PROGRESS  5.  Pt will return to highest level of function using RUE as dominant during functional task completion.  Goal status: IN PROGRESS  ASSESSMENT:  CLINICAL IMPRESSION: Pt reports that her HEP is tough but she is trying it, was able to put her hair up in a ponytail today but it took several tries. Continued with manual techniques and passive stretching, pt with improving ROM reaching approximately 65-75% today. Continued with AA/ROM, first rep is the toughest but then her performance improves. Attempted standing AA/ROM, pt able to complete er and abduction, unable to complete remainder due to weakness. Verbal cuing for form and technique.   PERFORMANCE DEFICITS: in functional skills including ADLs, IADLs, edema, ROM, strength, pain, fascial restrictions, Gross motor control, body mechanics, and UE functional use.   PLAN:  OT FREQUENCY: 2x/week  OT DURATION: 8 weeks  PLANNED INTERVENTIONS: self care/ADL training, therapeutic exercise, therapeutic activity, manual therapy, scar mobilization, passive range of motion, functional mobility training, electrical stimulation, ultrasound, moist heat, cryotherapy, patient/family  education, coping strategies training, and DME and/or AE instructions  CONSULTED AND AGREED WITH PLAN OF CARE: Patient  PLAN FOR NEXT SESSION: Mini-reassessment, manual Therapy, AA/ROM, pulleys, add pvc pipe slide   Guadelupe Sabin, OTR/L  [phone] 08/19/2022, 11:24 AM

## 2022-08-22 ENCOUNTER — Other Ambulatory Visit: Payer: Self-pay | Admitting: Orthopedic Surgery

## 2022-08-24 ENCOUNTER — Encounter (HOSPITAL_COMMUNITY): Payer: Self-pay | Admitting: Occupational Therapy

## 2022-08-24 ENCOUNTER — Ambulatory Visit (HOSPITAL_COMMUNITY): Payer: Medicare HMO | Attending: Orthopedic Surgery | Admitting: Occupational Therapy

## 2022-08-24 DIAGNOSIS — M25611 Stiffness of right shoulder, not elsewhere classified: Secondary | ICD-10-CM | POA: Diagnosis present

## 2022-08-24 DIAGNOSIS — R29898 Other symptoms and signs involving the musculoskeletal system: Secondary | ICD-10-CM | POA: Diagnosis present

## 2022-08-24 DIAGNOSIS — M25511 Pain in right shoulder: Secondary | ICD-10-CM | POA: Diagnosis present

## 2022-08-24 NOTE — Patient Instructions (Signed)

## 2022-08-24 NOTE — Therapy (Signed)
OUTPATIENT OCCUPATIONAL THERAPY ORTHO TREATMENT NOTE  Patient Name: April Murillo MRN: [id number] DOB:[birth year], 72 y.o., female Today's Date: 08/24/2022  PCP: Stoney Bang, MD REFERRING PROVIDER: Larena Glassman, MD  END OF SESSION:  OT End of Session - 08/24/22 0956     Visit Number 8    Number of Visits 17    Date for OT Re-Evaluation 10/02/22    Authorization Type [employer]    Progress Note Due on Visit 10    OT Start Time [phone]    OT Stop Time 1034    OT Time Calculation (min) 40 min    Activity Tolerance Patient tolerated treatment well    Behavior During Therapy [facility] for tasks assessed/performed                Past Medical History:  Diagnosis Date   Arthritis    Diabetes mellitus without complication    Hypercholesteremia    Hypertension    Past Surgical History:  Procedure Laterality Date   CATARACT EXTRACTION W/PHACO Right 09/18/2019   Procedure: CATARACT EXTRACTION PHACO AND INTRAOCULAR LENS PLACEMENT RIGHT EYE;  Surgeon: Baruch Goldmann, MD;  Location: [facility];  Service: Ophthalmology;  Laterality: Right;  CDE: 6.15   CATARACT EXTRACTION W/PHACO Left 10/02/2019   Procedure: CATARACT EXTRACTION PHACO AND INTRAOCULAR LENS PLACEMENT ([place]) CDE:;  Surgeon: Baruch Goldmann, MD;  Location: [facility];  Service: Ophthalmology;  Laterality: Left;   CLOSED REDUCTION / MANIPULATION JOINT     COLONOSCOPY     REVERSE SHOULDER ARTHROPLASTY Right 06/25/2022   Procedure: REVERSE SHOULDER ARTHROPLASTY;  Surgeon: Mordecai Rasmussen, MD;  Location: [facility];  Service: Orthopedics;  Laterality: Right;   Patient Active Problem List   Diagnosis Date Noted   Closed 4-part fracture of proximal humerus 06/25/2022   Bilateral primary osteoarthritis of knee 02/02/2019   Body mass index 36.0-36.9, adult 02/02/2019   Blood in stool, frank 12/19/2018    ONSET DATE: 07/13/22  REFERRING DIAG: R Reverse Total Shoulder Surgery  THERAPY DIAG:  Acute pain of right shoulder  Shoulder  stiffness, right  Other symptoms and signs involving the musculoskeletal system  Rationale for Evaluation and Treatment: Rehabilitation  SUBJECTIVE:   SUBJECTIVE STATEMENT: S: Those exercises are tough.   PERTINENT HISTORY: Pt fell on 06/10/22 fracturing her humerus and injuring her shoulder. Overall pt has been in an immobilizer ever since.   PRECAUTIONS: Shoulder-see protocol  WEIGHT BEARING RESTRICTIONS: Yes No lifting  PAIN:  Are you having pain? Yes: NPRS scale: 3/10 Pain location: right shoulder Pain description: Soreness Aggravating factors: sling Relieving factors: medication  FALLS: Has patient fallen in last 6 months? Yes. Number of falls 1  PATIENT GOALS: To get use of her arm back  NEXT MD VISIT: 6 weeks  OBJECTIVE:   HAND DOMINANCE: Right  ADLs: Overall ADLs: Pt requiring assist with all ADL's due to being unable to move her arm, she is requring total assist for cleaning and most cooking tasks.   FUNCTIONAL OUTCOME MEASURES: FOTO: 29.81  UPPER EXTREMITY ROM:       Assessed supine, er/IR adducted  Passive ROM In Supine Right eval  Shoulder flexion 113  Shoulder abduction 104  Shoulder internal rotation 90  Shoulder external rotation 49  Elbow flexion 140  Elbow extension -22  (Blank rows = not tested)  UPPER EXTREMITY MMT:     MMT Right eval  Shoulder flexion   Shoulder abduction   Shoulder internal rotation   Shoulder external rotation   (  Blank rows = not tested)  OBSERVATIONS: Severe fasical restrictions along the biceps, trapezius, and axillary region.    TODAY'S TREATMENT:                                                                                                                              DATE:  08/24/22 -manual Therapy: myofascial release and trigger point massage applied to biceps, trapezius, scapular region, and axillary region in order to reduce pain and fascial restrictions to improve ROM.  -AA/ROM: supine, flexion,  abduction, protraction, horizontal abduction, er/IR, x10 -Isometrics: flexion, extension, abduction, er, IR, 3x15" -Pulleys: 1' flexion, 1' abduction  08/19/22 -manual Therapy: myofascial release and trigger point massage applied to biceps, trapezius, scapular region, and axillary region in order to reduce pain and fascial restrictions to improve ROM.  -P/ROM: supine, flexion, abduction, horizontal abduction, er/IR, x5 -AA/ROM: supine-protraction, flexion, er/IR, horizontal abduction, 10 reps -AA/ROM: standing-abduction, er/IR, 10 reps -Wall wash: 1' low level -Pulleys: 1' flexion, 1' abduction  08/17/22 -manual Therapy: myofascial release and trigger point massage applied to biceps, trapezius, scapular region, and axillary region in order to reduce pain and fascial restrictions to improve ROM.  -P/ROM: supine, flexion, abduction, horizontal abduction, er/IR, x5 -AA/ROM: supine-protraction, flexion, er/IR, horizontal abduction, abduction (mod assist), 10 reps -Wall wash: 1' low level -Pulleys: 2' flexion, 2' abduction    PATIENT EDUCATION: Education details: Isometrics Person educated: Patient Education method: Explanation, Demonstration, and Handouts Education comprehension: verbalized understanding and returned demonstration  HOME EXERCISE PROGRAM: 3/7: Elbow and Wrist ROM, Pendulums 3/11: Table Slides 3/20: AA/ROM 4/1: Isometrics   GOALS: Goals reviewed with patient? Yes  SHORT TERM GOALS: Target date: 08/28/22  Pt will be provided with and educated on HEP to improve mobility in RUE required for use during ADL completion.  Goal status: IN PROGRESS  2.  Pt will increase RUE P/ROM by 40 degrees or greater to improve ability to use RUE during dressing tasks with minimal compensatory techniques.  Goal status: IN PROGRESS  3.  Pt will increase RUE strength to 3+/5 to improve ability to reach for items at waist to chest height during bathing and grooming tasks.  Goal status: IN  PROGRESS  LONG TERM GOALS: Target date: 09/25/22  Pt will decrease pain in RUE to 3/10 or less to improve ability to sleep for 2+ consecutive hours without waking due to pain.  Goal status: IN PROGRESS  2.  Pt will decrease RUE fascial restrictions to min amounts or less to improve mobility required for functional reaching tasks. Goal status: IN PROGRESS  3.   Pt will increase RUE A/ROM to at least 145 for flexion/abduction, and 50 degrees er/IR to improve ability to use RUE when reaching overhead or behind back during dressing and bathing tasks.  Goal status: IN PROGRESS  4.  Pt will increase RUE strength to 4+/5 or greater to improve ability to use LUE when lifting or carrying items during meal preparation/housework/yardwork tasks.  Goal status: IN PROGRESS  5.  Pt will return to highest level of function using RUE as dominant during functional task completion.  Goal status: IN PROGRESS  ASSESSMENT:  CLINICAL IMPRESSION: This session pt continues to demonstrate increased fascial restrictions and pain with mobility. She is requiring increased time and rest breaks throughout session. OT providing min assist for AA/ROM abduction and verbal cuing for technique with the other motions. Isometrics were added this session for low level strengthening and shoulder stability. Session ended with pulleys, as pt was not able to complete wall slides this session without max assist. Verbal and tactile cuing provided throughout for positioning and technique.  PERFORMANCE DEFICITS: in functional skills including ADLs, IADLs, edema, ROM, strength, pain, fascial restrictions, Gross motor control, body mechanics, and UE functional use.   PLAN:  OT FREQUENCY: 2x/week  OT DURATION: 8 weeks  PLANNED INTERVENTIONS: self care/ADL training, therapeutic exercise, therapeutic activity, manual therapy, scar mobilization, passive range of motion, functional mobility training, electrical stimulation, ultrasound,  moist heat, cryotherapy, patient/family education, coping strategies training, and DME and/or AE instructions  CONSULTED AND AGREED WITH PLAN OF CARE: Patient  PLAN FOR NEXT SESSION: Mini-reassessment, manual Therapy, AA/ROM, pulleys, add pvc pipe slide, isometrics, start scap strengthening   Paulita Fujita, OTR/L [phone] 08/24/2022, 9:57 AM

## 2022-08-26 ENCOUNTER — Encounter (HOSPITAL_COMMUNITY): Payer: Self-pay | Admitting: Occupational Therapy

## 2022-08-26 ENCOUNTER — Ambulatory Visit (HOSPITAL_COMMUNITY): Payer: Medicare HMO | Admitting: Occupational Therapy

## 2022-08-26 DIAGNOSIS — M25511 Pain in right shoulder: Secondary | ICD-10-CM

## 2022-08-26 DIAGNOSIS — M25611 Stiffness of right shoulder, not elsewhere classified: Secondary | ICD-10-CM | POA: Diagnosis not present

## 2022-08-26 DIAGNOSIS — R29898 Other symptoms and signs involving the musculoskeletal system: Secondary | ICD-10-CM

## 2022-08-26 NOTE — Therapy (Signed)
OUTPATIENT OCCUPATIONAL THERAPY ORTHO TREATMENT NOTE  Patient Name: April Murillo MRN: [id number] DOB:05/27/1950, 72 y.o., female Today's Date: 08/26/2022  PCP: Stoney Bang, MD REFERRING PROVIDER: Larena Glassman, MD  END OF SESSION:  OT End of Session - 08/26/22 0953     Visit Number 9    Number of Visits 17    Date for OT Re-Evaluation 10/02/22    Authorization Type [employer]    Progress Note Due on Visit 10    OT Start Time [phone]    OT Stop Time 1031    OT Time Calculation (min) 38 min    Activity Tolerance Patient tolerated treatment well    Behavior During Therapy [facility] for tasks assessed/performed             Past Medical History:  Diagnosis Date   Arthritis    Diabetes mellitus without complication    Hypercholesteremia    Hypertension    Past Surgical History:  Procedure Laterality Date   CATARACT EXTRACTION W/PHACO Right 09/18/2019   Procedure: CATARACT EXTRACTION PHACO AND INTRAOCULAR LENS PLACEMENT RIGHT EYE;  Surgeon: Baruch Goldmann, MD;  Location: [facility];  Service: Ophthalmology;  Laterality: Right;  CDE: 6.15   CATARACT EXTRACTION W/PHACO Left 10/02/2019   Procedure: CATARACT EXTRACTION PHACO AND INTRAOCULAR LENS PLACEMENT ([facility]) CDE:;  Surgeon: Baruch Goldmann, MD;  Location: [facility];  Service: Ophthalmology;  Laterality: Left;   CLOSED REDUCTION / MANIPULATION JOINT     COLONOSCOPY     REVERSE SHOULDER ARTHROPLASTY Right 06/25/2022   Procedure: REVERSE SHOULDER ARTHROPLASTY;  Surgeon: Mordecai Rasmussen, MD;  Location: [facility];  Service: Orthopedics;  Laterality: Right;   Patient Active Problem List   Diagnosis Date Noted   Closed 4-part fracture of proximal humerus 06/25/2022   Bilateral primary osteoarthritis of knee 02/02/2019   Body mass index 36.0-36.9, adult 02/02/2019   Blood in stool, frank 12/19/2018    ONSET DATE: 07/13/22  REFERRING DIAG: R Reverse Total Shoulder Surgery  THERAPY DIAG:  Acute pain of right shoulder  Shoulder stiffness,  right  Other symptoms and signs involving the musculoskeletal system  Rationale for Evaluation and Treatment: Rehabilitation  SUBJECTIVE:   SUBJECTIVE STATEMENT: S: Those exercises are tough.   PERTINENT HISTORY: Pt fell on 06/10/22 fracturing her humerus and injuring her shoulder. Overall pt has been in an immobilizer ever since.   PRECAUTIONS: Shoulder-see protocol  WEIGHT BEARING RESTRICTIONS: Yes No lifting  PAIN:  Are you having pain? Yes: NPRS scale: 3/10 Pain location: right shoulder Pain description: Soreness Aggravating factors: sling Relieving factors: medication  FALLS: Has patient fallen in last 6 months? Yes. Number of falls 1  PATIENT GOALS: To get use of her arm back  NEXT MD VISIT: 6 weeks  OBJECTIVE:   HAND DOMINANCE: Right  ADLs: Overall ADLs: Pt requiring assist with all ADL's due to being unable to move her arm, she is requring total assist for cleaning and most cooking tasks.   FUNCTIONAL OUTCOME MEASURES: FOTO: 29.81  UPPER EXTREMITY ROM:       Assessed supine, er/IR adducted  Passive ROM In Supine Right eval  Shoulder flexion 113  Shoulder abduction 104  Shoulder internal rotation 90  Shoulder external rotation 49  Elbow flexion 140  Elbow extension -22  (Blank rows = not tested)  UPPER EXTREMITY MMT:     MMT Right eval  Shoulder flexion   Shoulder abduction   Shoulder internal rotation   Shoulder external rotation   (Blank rows =  not tested)  OBSERVATIONS: Severe fasical restrictions along the biceps, trapezius, and axillary region.    TODAY'S TREATMENT:                                                                                                                              DATE:  08/26/22 -manual Therapy: myofascial release and trigger point massage applied to biceps, trapezius, scapular region, and axillary region in order to reduce pain and fascial restrictions to improve ROM.  -AA/ROM: seated, flexion, abduction,  protraction, horizontal abduction, er/IR, x10 -Wall Slide/walk: Flexion, abduction, x10 -scapular strengthening: red theraband, extension, retraction, rows, x10  08/24/22 -manual Therapy: myofascial release and trigger point massage applied to biceps, trapezius, scapular region, and axillary region in order to reduce pain and fascial restrictions to improve ROM.  -AA/ROM: supine, flexion, abduction, protraction, horizontal abduction, er/IR, x10 -Isometrics: flexion, extension, abduction, er, IR, 3x15" -Pulleys: 1' flexion, 1' abduction  08/19/22 -manual Therapy: myofascial release and trigger point massage applied to biceps, trapezius, scapular region, and axillary region in order to reduce pain and fascial restrictions to improve ROM.  -P/ROM: supine, flexion, abduction, horizontal abduction, er/IR, x5 -AA/ROM: supine-protraction, flexion, er/IR, horizontal abduction, 10 reps -AA/ROM: standing-abduction, er/IR, 10 reps -Wall wash: 1' low level -Pulleys: 1' flexion, 1' abduction    PATIENT EDUCATION: Education details: Wall Slides Person educated: Patient Education method: Explanation, Demonstration, and Handouts Education comprehension: verbalized understanding and returned demonstration  HOME EXERCISE PROGRAM: 3/7: Elbow and Wrist ROM, Pendulums 3/11: Table Slides 3/20: AA/ROM 4/1: Isometrics 4/3: Wall Slides   GOALS: Goals reviewed with patient? Yes  SHORT TERM GOALS: Target date: 08/28/22  Pt will be provided with and educated on HEP to improve mobility in RUE required for use during ADL completion.  Goal status: IN PROGRESS  2.  Pt will increase RUE P/ROM by 40 degrees or greater to improve ability to use RUE during dressing tasks with minimal compensatory techniques.  Goal status: IN PROGRESS  3.  Pt will increase RUE strength to 3+/5 to improve ability to reach for items at waist to chest height during bathing and grooming tasks.  Goal status: IN PROGRESS  LONG TERM  GOALS: Target date: 09/25/22  Pt will decrease pain in RUE to 3/10 or less to improve ability to sleep for 2+ consecutive hours without waking due to pain.  Goal status: IN PROGRESS  2.  Pt will decrease RUE fascial restrictions to min amounts or less to improve mobility required for functional reaching tasks. Goal status: IN PROGRESS  3.   Pt will increase RUE A/ROM to at least 145 for flexion/abduction, and 50 degrees er/IR to improve ability to use RUE when reaching overhead or behind back during dressing and bathing tasks.  Goal status: IN PROGRESS  4.  Pt will increase RUE strength to 4+/5 or greater to improve ability to use LUE when lifting or carrying items during meal preparation/housework/yardwork tasks. Goal status: IN PROGRESS  5.  Pt will return to highest level of function using RUE as dominant during functional task completion.  Goal status: IN PROGRESS  ASSESSMENT:  CLINICAL IMPRESSION: Pt working towards improving ROM, she is continuing to have increased pain with movement and limit ROM with AA/ROM to approximately 70-75% of full ROM. OT started pt on scapular strengthening this session with the red theraband. She required frequent tactile cuing for proper positioning. Throughout the session OT providing verbal and tactile cuing for positioning and technique.   PERFORMANCE DEFICITS: in functional skills including ADLs, IADLs, edema, ROM, strength, pain, fascial restrictions, Gross motor control, body mechanics, and UE functional use.   PLAN:  OT FREQUENCY: 2x/week  OT DURATION: 8 weeks  PLANNED INTERVENTIONS: self care/ADL training, therapeutic exercise, therapeutic activity, manual therapy, scar mobilization, passive range of motion, functional mobility training, electrical stimulation, ultrasound, moist heat, cryotherapy, patient/family education, coping strategies training, and DME and/or AE instructions  CONSULTED AND AGREED WITH PLAN OF CARE: Patient  PLAN FOR  NEXT SESSION: manual Therapy, AA/ROM, pulleys, add pvc pipe slide, isometrics, start scap strengthening   Paulita Fujita, OTR/L [phone] 08/26/2022, 9:54 AM

## 2022-08-31 ENCOUNTER — Ambulatory Visit (HOSPITAL_COMMUNITY): Payer: Medicare HMO | Admitting: Occupational Therapy

## 2022-08-31 ENCOUNTER — Encounter (HOSPITAL_COMMUNITY): Payer: Self-pay | Admitting: Occupational Therapy

## 2022-08-31 DIAGNOSIS — M25511 Pain in right shoulder: Secondary | ICD-10-CM | POA: Diagnosis not present

## 2022-08-31 DIAGNOSIS — R29898 Other symptoms and signs involving the musculoskeletal system: Secondary | ICD-10-CM | POA: Diagnosis not present

## 2022-08-31 DIAGNOSIS — M25611 Stiffness of right shoulder, not elsewhere classified: Secondary | ICD-10-CM

## 2022-08-31 NOTE — Therapy (Signed)
OUTPATIENT OCCUPATIONAL THERAPY ORTHO TREATMENT NOTE  Patient Name: April Murillo MRN: [id number] DOB:Mar 01, 1951, 72 y.o., female Today's Date: 08/31/2022  PCP: Lia Hopping, MD REFERRING PROVIDER: Thane Edu, MD  END OF SESSION:  OT End of Session - 08/31/22 0950     Visit Number 10    Number of Visits 17    Date for OT Re-Evaluation 10/02/22    Authorization Type [employer]    Progress Note Due on Visit 10    OT Start Time 0950    OT Stop Time 1030    OT Time Calculation (min) 40 min    Activity Tolerance Patient tolerated treatment well    Behavior During Therapy [facility] for tasks assessed/performed             Past Medical History:  Diagnosis Date   Arthritis    Diabetes mellitus without complication    Hypercholesteremia    Hypertension    Past Surgical History:  Procedure Laterality Date   CATARACT EXTRACTION W/PHACO Right 09/18/2019   Procedure: CATARACT EXTRACTION PHACO AND INTRAOCULAR LENS PLACEMENT RIGHT EYE;  Surgeon: Fabio Pierce, MD;  Location: [facility];  Service: Ophthalmology;  Laterality: Right;  CDE: 6.15   CATARACT EXTRACTION W/PHACO Left 10/02/2019   Procedure: CATARACT EXTRACTION PHACO AND INTRAOCULAR LENS PLACEMENT ([facility]) CDE:;  Surgeon: Fabio Pierce, MD;  Location: [facility];  Service: Ophthalmology;  Laterality: Left;   CLOSED REDUCTION / MANIPULATION JOINT     COLONOSCOPY     REVERSE SHOULDER ARTHROPLASTY Right 06/25/2022   Procedure: REVERSE SHOULDER ARTHROPLASTY;  Surgeon: Oliver Barre, MD;  Location: [facility];  Service: Orthopedics;  Laterality: Right;   Patient Active Problem List   Diagnosis Date Noted   Closed 4-part fracture of proximal humerus 06/25/2022   Bilateral primary osteoarthritis of knee 02/02/2019   Body mass index 36.0-36.9, adult 02/02/2019   Blood in stool, frank 12/19/2018    ONSET DATE: 07/13/22  REFERRING DIAG: R Reverse Total Shoulder Surgery  THERAPY DIAG:  Acute pain of right shoulder  Shoulder stiffness,  right  Other symptoms and signs involving the musculoskeletal system  Rationale for Evaluation and Treatment: Rehabilitation  SUBJECTIVE:   SUBJECTIVE STATEMENT: S: Those exercises are tough.   PERTINENT HISTORY: Pt fell on 06/10/22 fracturing her humerus and injuring her shoulder. Overall pt has been in an immobilizer ever since.   PRECAUTIONS: Shoulder-see protocol  WEIGHT BEARING RESTRICTIONS: Yes No lifting  PAIN:  Are you having pain? Yes: NPRS scale: 3/10 Pain location: right shoulder Pain description: Soreness Aggravating factors: sling Relieving factors: medication  FALLS: Has patient fallen in last 6 months? Yes. Number of falls 1  PATIENT GOALS: To get use of her arm back  NEXT MD VISIT: 6 weeks  OBJECTIVE:   HAND DOMINANCE: Right  ADLs: Overall ADLs: Pt requiring assist with all ADL's due to being unable to move her arm, she is requring total assist for cleaning and most cooking tasks.   FUNCTIONAL OUTCOME MEASURES: FOTO: 29.81 08/31/22: 52.09  UPPER EXTREMITY ROM:       Assessed supine, er/IR adducted  Passive ROM In Supine Right eval Right 08/31/22  Shoulder flexion 113 129  Shoulder abduction 104 139  Shoulder internal rotation 90 90  Shoulder external rotation 49 45  Elbow flexion 140   Elbow extension -22   (Blank rows = not tested)  Active ROM In Seated Right 08/31/22  Shoulder flexion   Shoulder abduction   Shoulder internal rotation   Shoulder external  rotation   Elbow flexion   Elbow extension   (Blank rows = not tested)  UPPER EXTREMITY MMT:     MMT Right eval  Shoulder flexion   Shoulder abduction   Shoulder internal rotation   Shoulder external rotation   (Blank rows = not tested)  OBSERVATIONS: Severe fasical restrictions along the biceps, trapezius, and axillary region.    TODAY'S TREATMENT:                                                                                                                               DATE:  08/31/22 -AA/ROM: seated, flexion, abduction, protraction, horizontal abduction, er/IR, x10 -AA/ROM: supine, flexion, protraction horizontal abduction, er/IR, x10 -P/ROM: supine, flexion, abduction, horizontal abduction, er/IR, x10 -PVC Pipe Slides: flexion to abduction, x10   08/26/22 -manual Therapy: myofascial release and trigger point massage applied to biceps, trapezius, scapular region, and axillary region in order to reduce pain and fascial restrictions to improve ROM.  -AA/ROM: seated, flexion, abduction, protraction, horizontal abduction, er/IR, x10 -Wall Slide/walk: Flexion, abduction, x10 -scapular strengthening: red theraband, extension, retraction, rows, x10  08/24/22 -manual Therapy: myofascial release and trigger point massage applied to biceps, trapezius, scapular region, and axillary region in order to reduce pain and fascial restrictions to improve ROM.  -AA/ROM: supine, flexion, abduction, protraction, horizontal abduction, er/IR, x10 -Isometrics: flexion, extension, abduction, er, IR, 3x15" -Pulleys: 1' flexion, 1' abduction  08/19/22 -manual Therapy: myofascial release and trigger point massage applied to biceps, trapezius, scapular region, and axillary region in order to reduce pain and fascial restrictions to improve ROM.  -P/ROM: supine, flexion, abduction, horizontal abduction, er/IR, x5 -AA/ROM: supine-protraction, flexion, er/IR, horizontal abduction, 10 reps -AA/ROM: standing-abduction, er/IR, 10 reps -Wall wash: 1' low level -Pulleys: 1' flexion, 1' abduction    PATIENT EDUCATION: Education details: Wall Slides Person educated: Patient Education method: Explanation, Demonstration, and Handouts Education comprehension: verbalized understanding and returned demonstration  HOME EXERCISE PROGRAM: 3/7: Elbow and Wrist ROM, Pendulums 3/11: Table Slides 3/20: AA/ROM 4/1: Isometrics 4/3: Wall Slides   GOALS: Goals reviewed with patient? Yes  SHORT  TERM GOALS: Target date: 08/28/22  Pt will be provided with and educated on HEP to improve mobility in RUE required for use during ADL completion.  Goal status: IN PROGRESS  2.  Pt will increase RUE P/ROM by 40 degrees or greater to improve ability to use RUE during dressing tasks with minimal compensatory techniques.  Goal status: IN PROGRESS  3.  Pt will increase RUE strength to 3+/5 to improve ability to reach for items at waist to chest height during bathing and grooming tasks.  Goal status: IN PROGRESS  LONG TERM GOALS: Target date: 09/25/22  Pt will decrease pain in RUE to 3/10 or less to improve ability to sleep for 2+ consecutive hours without waking due to pain.  Goal status: IN PROGRESS  2.  Pt will decrease RUE fascial restrictions to min amounts or less to improve  mobility required for functional reaching tasks. Goal status: IN PROGRESS  3.   Pt will increase RUE A/ROM to at least 145 for flexion/abduction, and 50 degrees er/IR to improve ability to use RUE when reaching overhead or behind back during dressing and bathing tasks.  Goal status: IN PROGRESS  4.  Pt will increase RUE strength to 4+/5 or greater to improve ability to use LUE when lifting or carrying items during meal preparation/housework/yardwork tasks. Goal status: IN PROGRESS  5.  Pt will return to highest level of function using RUE as dominant during functional task completion.  Goal status: IN PROGRESS  ASSESSMENT:  CLINICAL IMPRESSION: Pt working towards improving ROM, she is continuing to have increased pain with movement and limit ROM with AA/ROM to approximately 70-75% of full ROM. OT started pt on scapular strengthening this session with the red theraband. She required frequent tactile cuing for proper positioning. Throughout the session OT providing verbal and tactile cuing for positioning and technique.   PERFORMANCE DEFICITS: in functional skills including ADLs, IADLs, edema, ROM, strength, pain,  fascial restrictions, Gross motor control, body mechanics, and UE functional use.   PLAN:  OT FREQUENCY: 2x/week  OT DURATION: 8 weeks  PLANNED INTERVENTIONS: self care/ADL training, therapeutic exercise, therapeutic activity, manual therapy, scar mobilization, passive range of motion, functional mobility training, electrical stimulation, ultrasound, moist heat, cryotherapy, patient/family education, coping strategies training, and DME and/or AE instructions  CONSULTED AND AGREED WITH PLAN OF CARE: Patient  PLAN FOR NEXT SESSION: manual Therapy, AA/ROM, pulleys, add pvc pipe slide, isometrics, start scap strengthening   Trish MageClara Yuriko Portales, OTR/L [phone] 08/31/2022, 9:55 AM

## 2022-09-02 ENCOUNTER — Encounter (HOSPITAL_COMMUNITY): Payer: Self-pay | Admitting: Occupational Therapy

## 2022-09-02 ENCOUNTER — Ambulatory Visit (HOSPITAL_COMMUNITY): Payer: Medicare HMO | Admitting: Occupational Therapy

## 2022-09-02 DIAGNOSIS — R29898 Other symptoms and signs involving the musculoskeletal system: Secondary | ICD-10-CM | POA: Diagnosis not present

## 2022-09-02 DIAGNOSIS — M25511 Pain in right shoulder: Secondary | ICD-10-CM

## 2022-09-02 DIAGNOSIS — M25611 Stiffness of right shoulder, not elsewhere classified: Secondary | ICD-10-CM

## 2022-09-02 NOTE — Therapy (Signed)
OUTPATIENT OCCUPATIONAL THERAPY ORTHO TREATMENT NOTE  Patient Name: April Murillo MRN: [id number] DOB:[birth year], 72 y.o., female Today's Date: 09/02/2022  PCP: Lia Hopping, MD REFERRING PROVIDER: Thane Edu, MD  END OF SESSION:  OT End of Session - 09/02/22 1147     Visit Number 11    Number of Visits 17    Date for OT Re-Evaluation 10/02/22    Authorization Type [employer]    Progress Note Due on Visit 10    OT Start Time 1120    OT Stop Time 1200    OT Time Calculation (min) 40 min    Activity Tolerance Patient tolerated treatment well    Behavior During Therapy WFL for tasks assessed/performed             Past Medical History:  Diagnosis Date   Arthritis    Diabetes mellitus without complication    Hypercholesteremia    Hypertension    Past Surgical History:  Procedure Laterality Date   CATARACT EXTRACTION W/PHACO Right 09/18/2019   Procedure: CATARACT EXTRACTION PHACO AND INTRAOCULAR LENS PLACEMENT RIGHT EYE;  Surgeon: Fabio Pierce, MD;  Location: [facility];  Service: Ophthalmology;  Laterality: Right;  CDE: 6.15   CATARACT EXTRACTION W/PHACO Left 10/02/2019   Procedure: CATARACT EXTRACTION PHACO AND INTRAOCULAR LENS PLACEMENT (IOC) CDE:;  Surgeon: Fabio Pierce, MD;  Location: [facility];  Service: Ophthalmology;  Laterality: Left;   CLOSED REDUCTION / MANIPULATION JOINT     COLONOSCOPY     REVERSE SHOULDER ARTHROPLASTY Right 06/25/2022   Procedure: REVERSE SHOULDER ARTHROPLASTY;  Surgeon: Oliver Barre, MD;  Location: [facility];  Service: Orthopedics;  Laterality: Right;   Patient Active Problem List   Diagnosis Date Noted   Closed 4-part fracture of proximal humerus 06/25/2022   Bilateral primary osteoarthritis of knee 02/02/2019   Body mass index 36.0-36.9, adult 02/02/2019   Blood in stool, frank 12/19/2018    ONSET DATE: 07/13/22  REFERRING DIAG: R Reverse Total Shoulder Surgery  THERAPY DIAG:  Acute pain of right shoulder  Shoulder stiffness,  right  Other symptoms and signs involving the musculoskeletal system  Rationale for Evaluation and Treatment: Rehabilitation  SUBJECTIVE:   SUBJECTIVE STATEMENT: S:  I still can't lift it on my own.  PERTINENT HISTORY: Pt fell on 06/10/22 fracturing her humerus and injuring her shoulder. Overall pt has been in an immobilizer ever since.   PRECAUTIONS: Shoulder-see protocol  WEIGHT BEARING RESTRICTIONS: Yes No lifting  PAIN:  Are you having pain? Yes: NPRS scale: 3/10 Pain location: right shoulder Pain description: Soreness Aggravating factors: sling Relieving factors: medication  FALLS: Has patient fallen in last 6 months? Yes. Number of falls 1  PATIENT GOALS: To get use of her arm back  NEXT MD VISIT: 6 weeks  OBJECTIVE:   HAND DOMINANCE: Right  ADLs: Overall ADLs: Pt requiring assist with all ADL's due to being unable to move her arm, she is requring total assist for cleaning and most cooking tasks.   FUNCTIONAL OUTCOME MEASURES: FOTO: 29.81 08/31/22: 52.09  UPPER EXTREMITY ROM:       Assessed supine, er/IR adducted  Passive ROM In Supine Right eval Right 08/31/22  Shoulder flexion 113 129  Shoulder abduction 104 139  Shoulder internal rotation 90 90  Shoulder external rotation 49 45  Elbow flexion 140   Elbow extension -22   (Blank rows = not tested)  Active ROM In Seated Right 08/31/22  Shoulder flexion   Shoulder abduction   Shoulder internal rotation  Shoulder external rotation   Elbow flexion   Elbow extension   (Blank rows = not tested)  UPPER EXTREMITY MMT:     MMT Right eval  Shoulder flexion   Shoulder abduction   Shoulder internal rotation   Shoulder external rotation   (Blank rows = not tested)  OBSERVATIONS: Severe fasical restrictions along the biceps, trapezius, and axillary region.    TODAY'S TREATMENT:                                                                                                                               DATE:  09/02/22 -AA/ROM: 2lb dowel bar, supine, flexion, protraction horizontal abduction, er/IR, x10 -AA/ROM: 2lb dowel bar, seated, flexion, abduction, protraction, horizontal abduction, er/IR, x10 -attempted A/ROM in both sitting and supine in all motions - poor movement pattern -Pulleys: 1' flexion, 1' abduction -UBE: level 1, 3.0 KPH or higher, 2.5' forwards and backwards  08/31/22 -AA/ROM: seated, flexion, abduction, protraction, horizontal abduction, er/IR, x10 -AA/ROM: supine, flexion, protraction horizontal abduction, er/IR, x10 -P/ROM: supine, flexion, abduction, horizontal abduction, er/IR, x10 -PVC Pipe Slides: flexion to abduction, x10 -Measurements for reassessment  08/26/22 -manual Therapy: myofascial release and trigger point massage applied to biceps, trapezius, scapular region, and axillary region in order to reduce pain and fascial restrictions to improve ROM.  -AA/ROM: seated, flexion, abduction, protraction, horizontal abduction, er/IR, x10 -Wall Slide/walk: Flexion, abduction, x10 -scapular strengthening: red theraband, extension, retraction, rows, x10   PATIENT EDUCATION: Education details: A/ROM Person educated: Patient Education method: Programmer, multimedia, Facilities manager, and Handouts Education comprehension: verbalized understanding and returned demonstration  HOME EXERCISE PROGRAM: 3/7: Elbow and Wrist ROM, Pendulums 3/11: Table Slides 3/20: AA/ROM 4/1: Isometrics 4/3: Wall Slides 4/10: A/ROM   GOALS: Goals reviewed with patient? Yes  SHORT TERM GOALS: Target date: 08/28/22  Pt will be provided with and educated on HEP to improve mobility in RUE required for use during ADL completion.  Goal status: IN PROGRESS  2.  Pt will increase RUE P/ROM by 40 degrees or greater to improve ability to use RUE during dressing tasks with minimal compensatory techniques.  Goal status: IN PROGRESS  3.  Pt will increase RUE strength to 3+/5 to improve ability to reach for  items at waist to chest height during bathing and grooming tasks.  Goal status: IN PROGRESS  LONG TERM GOALS: Target date: 09/25/22  Pt will decrease pain in RUE to 3/10 or less to improve ability to sleep for 2+ consecutive hours without waking due to pain.  Goal status: IN PROGRESS  2.  Pt will decrease RUE fascial restrictions to min amounts or less to improve mobility required for functional reaching tasks. Goal status: IN PROGRESS  3.   Pt will increase RUE A/ROM to at least 145 for flexion/abduction, and 50 degrees er/IR to improve ability to use RUE when reaching overhead or behind back during dressing and bathing tasks.  Goal status: IN PROGRESS  4.  Pt  will increase RUE strength to 4+/5 or greater to improve ability to use LUE when lifting or carrying items during meal preparation/housework/yardwork tasks. Goal status: IN PROGRESS  5.  Pt will return to highest level of function using RUE as dominant during functional task completion.  Goal status: IN PROGRESS  ASSESSMENT:  CLINICAL IMPRESSION: This session pt continues to demonstrate difficulty with A/ROM. She attempted in both sitting and supine, however was unable to bring her upper arm away from her side. With AA/ROM, she has difficulty with abduction in supine, OT providing mod assist to lift her arm out and up to complete the movement, however in sitting she is able to achieve approximately 60% of full ROM. OT had her complete Pulleys and the UBE this session for good movement pattern and endurance. Verbal and tactile cuing provided for positioning and technique.    PERFORMANCE DEFICITS: in functional skills including ADLs, IADLs, edema, ROM, strength, pain, fascial restrictions, Gross motor control, body mechanics, and UE functional use.   PLAN:  OT FREQUENCY: 2x/week  OT DURATION: 8 weeks  PLANNED INTERVENTIONS: self care/ADL training, therapeutic exercise, therapeutic activity, manual therapy, scar mobilization,  passive range of motion, functional mobility training, electrical stimulation, ultrasound, moist heat, cryotherapy, patient/family education, coping strategies training, and DME and/or AE instructions  CONSULTED AND AGREED WITH PLAN OF CARE: Patient  PLAN FOR NEXT SESSION: manual Therapy, AA/ROM, pulleys, add pvc pipe slide, isometrics, scap strengthening, A/ROM as able   Trish MageClara Klark Vanderhoef, OTR/L [phone] 09/02/2022, 11:53 AM

## 2022-09-02 NOTE — Patient Instructions (Signed)

## 2022-09-07 ENCOUNTER — Encounter (HOSPITAL_COMMUNITY): Payer: Self-pay | Admitting: Occupational Therapy

## 2022-09-07 ENCOUNTER — Ambulatory Visit (HOSPITAL_COMMUNITY): Payer: Medicare HMO | Admitting: Occupational Therapy

## 2022-09-07 DIAGNOSIS — M25611 Stiffness of right shoulder, not elsewhere classified: Secondary | ICD-10-CM

## 2022-09-07 DIAGNOSIS — M25511 Pain in right shoulder: Secondary | ICD-10-CM

## 2022-09-07 DIAGNOSIS — R29898 Other symptoms and signs involving the musculoskeletal system: Secondary | ICD-10-CM | POA: Diagnosis not present

## 2022-09-07 NOTE — Therapy (Signed)
OUTPATIENT OCCUPATIONAL THERAPY ORTHO TREATMENT NOTE  Patient Name: April Murillo MRN: [id number] DOB:January 04, 1951, 72 y.o., female Today's Date: 09/07/2022  PCP: Lia Hopping, MD REFERRING PROVIDER: Thane Edu, MD  END OF SESSION:  OT End of Session - 09/07/22 0909     Visit Number 12    Number of Visits 17    Date for OT Re-Evaluation 10/02/22    Authorization Type [employer]    Progress Note Due on Visit 10    OT Start Time 0908    OT Stop Time 0946    OT Time Calculation (min) 38 min    Activity Tolerance Patient tolerated treatment well    Behavior During Therapy [facility] for tasks assessed/performed             Past Medical History:  Diagnosis Date   Arthritis    Diabetes mellitus without complication    Hypercholesteremia    Hypertension    Past Surgical History:  Procedure Laterality Date   CATARACT EXTRACTION W/PHACO Right 09/18/2019   Procedure: CATARACT EXTRACTION PHACO AND INTRAOCULAR LENS PLACEMENT RIGHT EYE;  Surgeon: Fabio Pierce, MD;  Location: [facility];  Service: Ophthalmology;  Laterality: Right;  CDE: 6.15   CATARACT EXTRACTION W/PHACO Left 10/02/2019   Procedure: CATARACT EXTRACTION PHACO AND INTRAOCULAR LENS PLACEMENT (IOC) CDE:;  Surgeon: Fabio Pierce, MD;  Location: [facility];  Service: Ophthalmology;  Laterality: Left;   CLOSED REDUCTION / MANIPULATION JOINT     COLONOSCOPY     REVERSE SHOULDER ARTHROPLASTY Right 06/25/2022   Procedure: REVERSE SHOULDER ARTHROPLASTY;  Surgeon: Oliver Barre, MD;  Location: [facility];  Service: Orthopedics;  Laterality: Right;   Patient Active Problem List   Diagnosis Date Noted   Closed 4-part fracture of proximal humerus 06/25/2022   Bilateral primary osteoarthritis of knee 02/02/2019   Body mass index 36.0-36.9, adult 02/02/2019   Blood in stool, frank 12/19/2018    ONSET DATE: 07/13/22  REFERRING DIAG: R Reverse Total Shoulder Surgery  THERAPY DIAG:  Acute pain of right shoulder  Shoulder stiffness,  right  Other symptoms and signs involving the musculoskeletal system  Rationale for Evaluation and Treatment: Rehabilitation  SUBJECTIVE:   SUBJECTIVE STATEMENT: S:  It's always sore.  PERTINENT HISTORY: Pt fell on 06/10/22 fracturing her humerus and injuring her shoulder. Overall pt has been in an immobilizer ever since.   PRECAUTIONS: Shoulder-see protocol  WEIGHT BEARING RESTRICTIONS: Yes No lifting  PAIN:  Are you having pain? Yes: NPRS scale: 5/10 Pain location: right shoulder Pain description: Soreness Aggravating factors: sling Relieving factors: medication  FALLS: Has patient fallen in last 6 months? Yes. Number of falls 1  PATIENT GOALS: To get use of her arm back  NEXT MD VISIT: 6 weeks  OBJECTIVE:   HAND DOMINANCE: Right  ADLs: Overall ADLs: Pt requiring assist with all ADL's due to being unable to move her arm, she is requring total assist for cleaning and most cooking tasks.   FUNCTIONAL OUTCOME MEASURES: FOTO: 29.81 08/31/22: 52.09  UPPER EXTREMITY ROM:       Assessed supine, er/IR adducted  Passive ROM In Supine Right eval Right 08/31/22  Shoulder flexion 113 129  Shoulder abduction 104 139  Shoulder internal rotation 90 90  Shoulder external rotation 49 45  Elbow flexion 140   Elbow extension -22   (Blank rows = not tested)  Active ROM In Seated Right 08/31/22  Shoulder flexion   Shoulder abduction   Shoulder internal rotation   Shoulder external rotation  Elbow flexion   Elbow extension   (Blank rows = not tested)  UPPER EXTREMITY MMT:     MMT Right eval  Shoulder flexion   Shoulder abduction   Shoulder internal rotation   Shoulder external rotation   (Blank rows = not tested)  OBSERVATIONS: Severe fasical restrictions along the biceps, trapezius, and axillary region.    TODAY'S TREATMENT:                                                                                                                              DATE:   09/07/22 -AA/ROM: supine, flexion, protraction horizontal abduction, er/IR, x15 -Wall Walk: flexion, x10 -AA/ROM with OT assist: abduction standing, x10 -Proximal shoulder Exercises: paddles, criss cross, circles both directions, x10 -Table Slides: flexion, abduction, x10 -2lb dumbbell in both hands, chest press, flexion, V ups, x10 -Pulleys: 1' flexion, 1' abduction  09/02/22 -AA/ROM: 2lb dowel bar, supine, flexion, protraction horizontal abduction, er/IR, x10 -AA/ROM: 2lb dowel bar, seated, flexion, abduction, protraction, horizontal abduction, er/IR, x10 -attempted A/ROM in both sitting and supine in all motions - poor movement pattern -Pulleys: 1' flexion, 1' abduction -UBE: level 1, 3.0 KPH or higher, 2.5' forwards and backwards  08/31/22 -AA/ROM: seated, flexion, abduction, protraction, horizontal abduction, er/IR, x10 -AA/ROM: supine, flexion, protraction horizontal abduction, er/IR, x10 -P/ROM: supine, flexion, abduction, horizontal abduction, er/IR, x10 -PVC Pipe Slides: flexion to abduction, x10 -Measurements for reassessment  08/26/22 -manual Therapy: myofascial release and trigger point massage applied to biceps, trapezius, scapular region, and axillary region in order to reduce pain and fascial restrictions to improve ROM.  -AA/ROM: seated, flexion, abduction, protraction, horizontal abduction, er/IR, x10 -Wall Slide/walk: Flexion, abduction, x10 -scapular strengthening: red theraband, extension, retraction, rows, x10   PATIENT EDUCATION: Education details: A/ROM Person educated: Patient Education method: Programmer, multimedia, Facilities manager, and Handouts Education comprehension: verbalized understanding and returned demonstration  HOME EXERCISE PROGRAM: 3/7: Elbow and Wrist ROM, Pendulums 3/11: Table Slides 3/20: AA/ROM 4/1: Isometrics 4/3: Wall Slides 4/10: A/ROM   GOALS: Goals reviewed with patient? Yes  SHORT TERM GOALS: Target date: 08/28/22  Pt will be provided  with and educated on HEP to improve mobility in RUE required for use during ADL completion.  Goal status: IN PROGRESS  2.  Pt will increase RUE P/ROM by 40 degrees or greater to improve ability to use RUE during dressing tasks with minimal compensatory techniques.  Goal status: IN PROGRESS  3.  Pt will increase RUE strength to 3+/5 to improve ability to reach for items at waist to chest height during bathing and grooming tasks.  Goal status: IN PROGRESS  LONG TERM GOALS: Target date: 09/25/22  Pt will decrease pain in RUE to 3/10 or less to improve ability to sleep for 2+ consecutive hours without waking due to pain.  Goal status: IN PROGRESS  2.  Pt will decrease RUE fascial restrictions to min amounts or less to improve mobility required for functional reaching tasks. Goal status:  IN PROGRESS  3.   Pt will increase RUE A/ROM to at least 145 for flexion/abduction, and 50 degrees er/IR to improve ability to use RUE when reaching overhead or behind back during dressing and bathing tasks.  Goal status: IN PROGRESS  4.  Pt will increase RUE strength to 4+/5 or greater to improve ability to use LUE when lifting or carrying items during meal preparation/housework/yardwork tasks. Goal status: IN PROGRESS  5.  Pt will return to highest level of function using RUE as dominant during functional task completion.  Goal status: IN PROGRESS  ASSESSMENT:  CLINICAL IMPRESSION: This session pt continues to demonstrate difficulty with A/ROM. She attempted in both sitting and supine, however was unable to bring her upper arm away from her side. With AA/ROM, she has difficulty with abduction in supine, OT providing mod assist to lift her arm out and up to complete the movement, however in sitting she is able to achieve approximately 60% of full ROM. OT had her complete Pulleys and the UBE this session for good movement pattern and endurance. Verbal and tactile cuing provided for positioning and technique.     PERFORMANCE DEFICITS: in functional skills including ADLs, IADLs, edema, ROM, strength, pain, fascial restrictions, Gross motor control, body mechanics, and UE functional use.   PLAN:  OT FREQUENCY: 2x/week  OT DURATION: 8 weeks  PLANNED INTERVENTIONS: self care/ADL training, therapeutic exercise, therapeutic activity, manual therapy, scar mobilization, passive range of motion, functional mobility training, electrical stimulation, ultrasound, moist heat, cryotherapy, patient/family education, coping strategies training, and DME and/or AE instructions  CONSULTED AND AGREED WITH PLAN OF CARE: Patient  PLAN FOR NEXT SESSION: manual Therapy, AA/ROM, pulleys, add pvc pipe slide, isometrics, scap strengthening, A/ROM as able   Trish Mage, OTR/L [phone] 09/07/2022, 9:11 AM

## 2022-09-09 ENCOUNTER — Ambulatory Visit (HOSPITAL_COMMUNITY): Payer: Medicare HMO | Admitting: Occupational Therapy

## 2022-09-09 ENCOUNTER — Encounter (HOSPITAL_COMMUNITY): Payer: Self-pay | Admitting: Occupational Therapy

## 2022-09-09 DIAGNOSIS — R29898 Other symptoms and signs involving the musculoskeletal system: Secondary | ICD-10-CM

## 2022-09-09 DIAGNOSIS — M25511 Pain in right shoulder: Secondary | ICD-10-CM

## 2022-09-09 DIAGNOSIS — M25611 Stiffness of right shoulder, not elsewhere classified: Secondary | ICD-10-CM | POA: Diagnosis not present

## 2022-09-09 NOTE — Therapy (Signed)
OUTPATIENT OCCUPATIONAL THERAPY ORTHO TREATMENT NOTE  Patient Name: April Murillo MRN: [id number] DOB:Jul 23, 1950, 72 y.o., female Today's Date: 09/09/2022  PCP: Lia Hopping, MD REFERRING PROVIDER: Thane Edu, MD  END OF SESSION:  OT End of Session - 09/09/22 0905     Visit Number 13    Number of Visits 17    Date for OT Re-Evaluation 10/02/22    Authorization Type [employer]    Progress Note Due on Visit 10    OT Start Time 0904    OT Stop Time 0945    OT Time Calculation (min) 41 min    Activity Tolerance Patient tolerated treatment well    Behavior During Therapy [facility] for tasks assessed/performed             Past Medical History:  Diagnosis Date   Arthritis    Diabetes mellitus without complication    Hypercholesteremia    Hypertension    Past Surgical History:  Procedure Laterality Date   CATARACT EXTRACTION W/PHACO Right 09/18/2019   Procedure: CATARACT EXTRACTION PHACO AND INTRAOCULAR LENS PLACEMENT RIGHT EYE;  Surgeon: Fabio Pierce, MD;  Location: [facility];  Service: Ophthalmology;  Laterality: Right;  CDE: 6.15   CATARACT EXTRACTION W/PHACO Left 10/02/2019   Procedure: CATARACT EXTRACTION PHACO AND INTRAOCULAR LENS PLACEMENT (IOC) CDE:;  Surgeon: Fabio Pierce, MD;  Location: [facility];  Service: Ophthalmology;  Laterality: Left;   CLOSED REDUCTION / MANIPULATION JOINT     COLONOSCOPY     REVERSE SHOULDER ARTHROPLASTY Right 06/25/2022   Procedure: REVERSE SHOULDER ARTHROPLASTY;  Surgeon: Oliver Barre, MD;  Location: [facility];  Service: Orthopedics;  Laterality: Right;   Patient Active Problem List   Diagnosis Date Noted   Closed 4-part fracture of proximal humerus 06/25/2022   Bilateral primary osteoarthritis of knee 02/02/2019   Body mass index 36.0-36.9, adult 02/02/2019   Blood in stool, frank 12/19/2018    ONSET DATE: 07/13/22  REFERRING DIAG: R Reverse Total Shoulder Surgery  THERAPY DIAG:  Acute pain of right shoulder  Shoulder stiffness,  right  Other symptoms and signs involving the musculoskeletal system  Rationale for Evaluation and Treatment: Rehabilitation  SUBJECTIVE:   SUBJECTIVE STATEMENT: S:  My arm won't lift up for the life of me.  PERTINENT HISTORY: Pt fell on 06/10/22 fracturing her humerus and injuring her shoulder. Overall pt has been in an immobilizer ever since.   PRECAUTIONS: Shoulder-see protocol  WEIGHT BEARING RESTRICTIONS: Yes No lifting  PAIN:  Are you having pain? Yes: NPRS scale: 2/10 Pain location: right shoulder Pain description: Soreness Aggravating factors: sling Relieving factors: medication  FALLS: Has patient fallen in last 6 months? Yes. Number of falls 1  PATIENT GOALS: To get use of her arm back  NEXT MD VISIT: 6 weeks  OBJECTIVE:   HAND DOMINANCE: Right  ADLs: Overall ADLs: Pt requiring assist with all ADL's due to being unable to move her arm, she is requring total assist for cleaning and most cooking tasks.   FUNCTIONAL OUTCOME MEASURES: FOTO: 29.81 08/31/22: 52.09  UPPER EXTREMITY ROM:       Assessed supine, er/IR adducted  Passive ROM In Supine Right eval Right 08/31/22  Shoulder flexion 113 129  Shoulder abduction 104 139  Shoulder internal rotation 90 90  Shoulder external rotation 49 45  Elbow flexion 140   Elbow extension -22   (Blank rows = not tested)  Active ROM In Seated Right 08/31/22  Shoulder flexion   Shoulder abduction   Shoulder  internal rotation   Shoulder external rotation   Elbow flexion   Elbow extension   (Blank rows = not tested)  UPPER EXTREMITY MMT:     MMT Right eval  Shoulder flexion   Shoulder abduction   Shoulder internal rotation   Shoulder external rotation   (Blank rows = not tested)  OBSERVATIONS: Severe fasical restrictions along the biceps, trapezius, and axillary region.    TODAY'S TREATMENT:                                                                                                                               DATE:  09/09/22 -Manual Therapy: myofascial release and trigger point applied to the biceps, trapezius, and scapular region in order to reduce pain and fascial restrictions to improve ROM.  -AA/ROM: 3lb dowel, supine, flexion, abduction (min A), protraction, horizontal abduction, er/IR, x12 -A/ROM: sidelying, er/IR, protraction, flexion, horizontal abduction, abduction, x10 -Wall Walks: flexion, x10 -Scapular Strengthening: red band, extension, rows, retraction, x10  09/07/22 -AA/ROM: supine, flexion, protraction horizontal abduction, er/IR, x15 -Wall Walk: flexion, x10 -AA/ROM with OT assist: abduction standing, x10 -Proximal shoulder Exercises: paddles, criss cross, circles both directions, x10 -Table Slides: flexion, abduction, x10 -2lb dumbbell in both hands, chest press, flexion, V ups, x10 -Pulleys: 1' flexion, 1' abduction  09/02/22 -AA/ROM: 2lb dowel bar, supine, flexion, protraction horizontal abduction, er/IR, x10 -AA/ROM: 2lb dowel bar, seated, flexion, abduction, protraction, horizontal abduction, er/IR, x10 -attempted A/ROM in both sitting and supine in all motions - poor movement pattern -Pulleys: 1' flexion, 1' abduction -UBE: level 1, 3.0 KPH or higher, 2.5' forwards and backwards   PATIENT EDUCATION: Education details: A/ROM in sidelying Person educated: Patient Education method: Programmer, multimedia, Demonstration, and Handouts Education comprehension: verbalized understanding and returned demonstration  HOME EXERCISE PROGRAM: 3/7: Elbow and Wrist ROM, Pendulums 3/11: Table Slides 3/20: AA/ROM 4/1: Isometrics 4/3: Wall Slides 4/10: A/ROM   GOALS: Goals reviewed with patient? Yes  SHORT TERM GOALS: Target date: 08/28/22  Pt will be provided with and educated on HEP to improve mobility in RUE required for use during ADL completion.  Goal status: IN PROGRESS  2.  Pt will increase RUE P/ROM by 40 degrees or greater to improve ability to use RUE during  dressing tasks with minimal compensatory techniques.  Goal status: IN PROGRESS  3.  Pt will increase RUE strength to 3+/5 to improve ability to reach for items at waist to chest height during bathing and grooming tasks.  Goal status: IN PROGRESS  LONG TERM GOALS: Target date: 09/25/22  Pt will decrease pain in RUE to 3/10 or less to improve ability to sleep for 2+ consecutive hours without waking due to pain.  Goal status: IN PROGRESS  2.  Pt will decrease RUE fascial restrictions to min amounts or less to improve mobility required for functional reaching tasks. Goal status: IN PROGRESS  3.   Pt will increase RUE A/ROM to at least 145 for  flexion/abduction, and 50 degrees er/IR to improve ability to use RUE when reaching overhead or behind back during dressing and bathing tasks.  Goal status: IN PROGRESS  4.  Pt will increase RUE strength to 4+/5 or greater to improve ability to use LUE when lifting or carrying items during meal preparation/housework/yardwork tasks. Goal status: IN PROGRESS  5.  Pt will return to highest level of function using RUE as dominant during functional task completion.  Goal status: IN PROGRESS  ASSESSMENT:  CLINICAL IMPRESSION: This session pt continuing to demonstrate difficulty with active ROM in sitting or supine. OT had pt complete A/ROM in side lying, where she was able to achieve 60-65% of full ROM. Pt continuing to work on wall walks, as she is unable to slide her hands up into flexion on the wall with good movement pattern. Verbal and tactile cuing provided for positioning and technique.   PERFORMANCE DEFICITS: in functional skills including ADLs, IADLs, edema, ROM, strength, pain, fascial restrictions, Gross motor control, body mechanics, and UE functional use.   PLAN:  OT FREQUENCY: 2x/week  OT DURATION: 8 weeks  PLANNED INTERVENTIONS: self care/ADL training, therapeutic exercise, therapeutic activity, manual therapy, scar mobilization, passive  range of motion, functional mobility training, electrical stimulation, ultrasound, moist heat, cryotherapy, patient/family education, coping strategies training, and DME and/or AE instructions  CONSULTED AND AGREED WITH PLAN OF CARE: Patient  PLAN FOR NEXT SESSION: manual Therapy, AA/ROM, pulleys, add pvc pipe slide, isometrics, scap strengthening, A/ROM as able start sidelying   Trish Mage, OTR/L [phone] 09/09/2022, 9:05 AM

## 2022-09-14 ENCOUNTER — Other Ambulatory Visit: Payer: Self-pay | Admitting: Orthopedic Surgery

## 2022-09-14 ENCOUNTER — Encounter (HOSPITAL_COMMUNITY): Payer: Self-pay | Admitting: Occupational Therapy

## 2022-09-14 ENCOUNTER — Ambulatory Visit (HOSPITAL_COMMUNITY): Payer: Medicare HMO | Admitting: Occupational Therapy

## 2022-09-14 DIAGNOSIS — R29898 Other symptoms and signs involving the musculoskeletal system: Secondary | ICD-10-CM | POA: Diagnosis not present

## 2022-09-14 DIAGNOSIS — M25511 Pain in right shoulder: Secondary | ICD-10-CM

## 2022-09-14 DIAGNOSIS — M25611 Stiffness of right shoulder, not elsewhere classified: Secondary | ICD-10-CM

## 2022-09-14 NOTE — Therapy (Unsigned)
OUTPATIENT OCCUPATIONAL THERAPY ORTHO TREATMENT NOTE  Patient Name: April Murillo MRN: [id number] DOB:December 23, 1950, 72 y.o., female Today's Date: 09/14/2022  PCP: Lia Hopping, MD REFERRING PROVIDER: Thane Edu, MD  END OF SESSION:  OT End of Session - 09/14/22 1051     Visit Number 14    Number of Visits 17    Date for OT Re-Evaluation 10/02/22    Authorization Type [employer]    Progress Note Due on Visit 10    OT Start Time 1041    OT Stop Time 1120    OT Time Calculation (min) 39 min    Activity Tolerance Patient tolerated treatment well    Behavior During Therapy WFL for tasks assessed/performed             Past Medical History:  Diagnosis Date   Arthritis    Diabetes mellitus without complication    Hypercholesteremia    Hypertension    Past Surgical History:  Procedure Laterality Date   CATARACT EXTRACTION W/PHACO Right 09/18/2019   Procedure: CATARACT EXTRACTION PHACO AND INTRAOCULAR LENS PLACEMENT RIGHT EYE;  Surgeon: Fabio Pierce, MD;  Location: [facility];  Service: Ophthalmology;  Laterality: Right;  CDE: 6.15   CATARACT EXTRACTION W/PHACO Left 10/02/2019   Procedure: CATARACT EXTRACTION PHACO AND INTRAOCULAR LENS PLACEMENT (IOC) CDE:;  Surgeon: Fabio Pierce, MD;  Location: [facility];  Service: Ophthalmology;  Laterality: Left;   CLOSED REDUCTION / MANIPULATION JOINT     COLONOSCOPY     REVERSE SHOULDER ARTHROPLASTY Right 06/25/2022   Procedure: REVERSE SHOULDER ARTHROPLASTY;  Surgeon: Oliver Barre, MD;  Location: [facility];  Service: Orthopedics;  Laterality: Right;   Patient Active Problem List   Diagnosis Date Noted   Closed 4-part fracture of proximal humerus 06/25/2022   Bilateral primary osteoarthritis of knee 02/02/2019   Body mass index 36.0-36.9, adult 02/02/2019   Blood in stool, frank 12/19/2018    ONSET DATE: 07/13/22  REFERRING DIAG: R Reverse Total Shoulder Surgery  THERAPY DIAG:  Acute pain of right shoulder  Shoulder stiffness,  right  Other symptoms and signs involving the musculoskeletal system  Rationale for Evaluation and Treatment: Rehabilitation  SUBJECTIVE:   SUBJECTIVE STATEMENT: S: I just want to be able to lift my arm   PERTINENT HISTORY: Pt fell on 06/10/22 fracturing her humerus and injuring her shoulder. Overall pt has been in an immobilizer ever since.   PRECAUTIONS: Shoulder-see protocol  WEIGHT BEARING RESTRICTIONS: Yes No lifting  PAIN:  Are you having pain? Yes: NPRS scale: 5/10 Pain location: right shoulder Pain description: Soreness Aggravating factors: sling Relieving factors: medication  FALLS: Has patient fallen in last 6 months? Yes. Number of falls 1  PATIENT GOALS: To get use of her arm back  NEXT MD VISIT: 6 weeks  OBJECTIVE:   HAND DOMINANCE: Right  ADLs: Overall ADLs: Pt requiring assist with all ADL's due to being unable to move her arm, she is requring total assist for cleaning and most cooking tasks.   FUNCTIONAL OUTCOME MEASURES: FOTO: 29.81 08/31/22: 52.09  UPPER EXTREMITY ROM:       Assessed supine, er/IR adducted  Passive ROM In Supine Right eval Right 08/31/22  Shoulder flexion 113 129  Shoulder abduction 104 139  Shoulder internal rotation 90 90  Shoulder external rotation 49 45  Elbow flexion 140   Elbow extension -22   (Blank rows = not tested)  Active ROM In Seated Right 08/31/22  Shoulder flexion   Shoulder abduction   Shoulder  internal rotation   Shoulder external rotation   Elbow flexion   Elbow extension   (Blank rows = not tested)  UPPER EXTREMITY MMT:     MMT Right eval  Shoulder flexion   Shoulder abduction   Shoulder internal rotation   Shoulder external rotation   (Blank rows = not tested)  OBSERVATIONS: Severe fasical restrictions along the biceps, trapezius, and axillary region.    TODAY'S TREATMENT:                                                                                                                               DATE:  09/14/22 -AA/ROM: 3lb dowel, supine, flexion, abduction (min A), protraction, horizontal abduction, er/IR, x15 -A/ROM: sidelying, er/IR, protraction, flexion, horizontal abduction, abduction, x10 -A/ROM: seated: er/IR, protraction, flexion (60 degrees), abduction (60 degrees), x10 -Latissimus stretch 4x20" switching BUE each rep -Scapular Strengthening: Green band, extension, retraction, rows, x15 [employer] on the wall, flexion, x10  09/09/22 -Manual Therapy: myofascial release and trigger point applied to the biceps, trapezius, and scapular region in order to reduce pain and fascial restrictions to improve ROM.  -AA/ROM: 3lb dowel, supine, flexion, abduction (min A), protraction, horizontal abduction, er/IR, x12 -A/ROM: sidelying, er/IR, protraction, flexion, horizontal abduction, abduction, x10 -Wall Walks: flexion, x10 -Scapular Strengthening: red band, extension, rows, retraction, x10  09/07/22 -AA/ROM: supine, flexion, protraction horizontal abduction, er/IR, x15 -Wall Walk: flexion, x10 -AA/ROM with OT assist: abduction standing, x10 -Proximal shoulder Exercises: paddles, criss cross, circles both directions, x10 -Table Slides: flexion, abduction, x10 -2lb dumbbell in both hands, chest press, flexion, V ups, x10 -Pulleys: 1' flexion, 1' abduction   PATIENT EDUCATION: Education details: Publishing rights manager Person educated: Patient Education method: Explanation, Demonstration, and Handouts Education comprehension: verbalized understanding and returned demonstration  HOME EXERCISE PROGRAM: 3/7: Elbow and Wrist ROM, Pendulums 3/11: Table Slides 3/20: AA/ROM 4/1: Isometrics 4/3: Wall Slides 4/10: A/ROM 4/22: Scapular strengthening   GOALS: Goals reviewed with patient? Yes  SHORT TERM GOALS: Target date: 08/28/22  Pt will be provided with and educated on HEP to improve mobility in RUE required for use during ADL completion.  Goal status: IN PROGRESS  2.   Pt will increase RUE P/ROM by 40 degrees or greater to improve ability to use RUE during dressing tasks with minimal compensatory techniques.  Goal status: IN PROGRESS  3.  Pt will increase RUE strength to 3+/5 to improve ability to reach for items at waist to chest height during bathing and grooming tasks.  Goal status: IN PROGRESS  LONG TERM GOALS: Target date: 09/25/22  Pt will decrease pain in RUE to 3/10 or less to improve ability to sleep for 2+ consecutive hours without waking due to pain.  Goal status: IN PROGRESS  2.  Pt will decrease RUE fascial restrictions to min amounts or less to improve mobility required for functional reaching tasks. Goal status: IN PROGRESS  3.   Pt will increase RUE A/ROM to  at least 145 for flexion/abduction, and 50 degrees er/IR to improve ability to use RUE when reaching overhead or behind back during dressing and bathing tasks.  Goal status: IN PROGRESS  4.  Pt will increase RUE strength to 4+/5 or greater to improve ability to use LUE when lifting or carrying items during meal preparation/housework/yardwork tasks. Goal status: IN PROGRESS  5.  Pt will return to highest level of function using RUE as dominant during functional task completion.  Goal status: IN PROGRESS  ASSESSMENT:  CLINICAL IMPRESSION: Pt is making good improvements with A/ROM in side lying. She is able to obtain 75% of full ROM in all motions in the gravity eliminated plane. When sitting up for A/ROM, she is able to achieve a slight increase in ROM, to approximately 35-40% of full ROM.  She was able to increase theraband from red to green bands for scapular strengthening. OT providing verbal and tactile cuing for positioning and technique throughout session.   PERFORMANCE DEFICITS: in functional skills including ADLs, IADLs, edema, ROM, strength, pain, fascial restrictions, Gross motor control, body mechanics, and UE functional use.   PLAN:  OT FREQUENCY: 2x/week  OT  DURATION: 8 weeks  PLANNED INTERVENTIONS: self care/ADL training, therapeutic exercise, therapeutic activity, manual therapy, scar mobilization, passive range of motion, functional mobility training, electrical stimulation, ultrasound, moist heat, cryotherapy, patient/family education, coping strategies training, and DME and/or AE instructions  CONSULTED AND AGREED WITH PLAN OF CARE: Patient  PLAN FOR NEXT SESSION: manual Therapy, AA/ROM, pulleys, add pvc pipe slide, isometrics, scap strengthening, A/ROM as able start sidelying   Trish Mage, OTR/L [phone] 09/14/2022, 10:52 AM

## 2022-09-14 NOTE — Patient Instructions (Signed)

## 2022-09-15 DIAGNOSIS — Z780 Asymptomatic menopausal state: Secondary | ICD-10-CM | POA: Diagnosis not present

## 2022-09-17 ENCOUNTER — Ambulatory Visit (HOSPITAL_COMMUNITY): Payer: Medicare HMO | Admitting: Occupational Therapy

## 2022-09-17 ENCOUNTER — Encounter (HOSPITAL_COMMUNITY): Payer: Self-pay | Admitting: Occupational Therapy

## 2022-09-17 DIAGNOSIS — R29898 Other symptoms and signs involving the musculoskeletal system: Secondary | ICD-10-CM

## 2022-09-17 DIAGNOSIS — M25511 Pain in right shoulder: Secondary | ICD-10-CM

## 2022-09-17 DIAGNOSIS — M25611 Stiffness of right shoulder, not elsewhere classified: Secondary | ICD-10-CM

## 2022-09-17 NOTE — Therapy (Signed)
OUTPATIENT OCCUPATIONAL THERAPY ORTHO TREATMENT NOTE  Patient Name: April Murillo MRN: [id number] DOB:04-Oct-1950, 72 y.o., female Today's Date: 09/20/2022  PCP: Lia Hopping, MD REFERRING PROVIDER: Thane Edu, MD  END OF SESSION:   09/17/22 1305  OT Visits / Re-Eval  Visit Number 15  Number of Visits 17  Date for OT Re-Evaluation 10/02/22  Authorization  Authorization Type [employer]  Progress Note Due on Visit 10  OT Time Calculation  OT Start Time 1305  OT Stop Time 1345  OT Time Calculation (min) 40 min  End of Session  Activity Tolerance Patient tolerated treatment well  Behavior During Therapy WFL for tasks assessed/performed   Past Medical History:  Diagnosis Date   Arthritis    Diabetes mellitus without complication (HCC)    Hypercholesteremia    Hypertension    Past Surgical History:  Procedure Laterality Date   CATARACT EXTRACTION W/PHACO Right 09/18/2019   Procedure: CATARACT EXTRACTION PHACO AND INTRAOCULAR LENS PLACEMENT RIGHT EYE;  Surgeon: Fabio Pierce, MD;  Location: [facility];  Service: Ophthalmology;  Laterality: Right;  CDE: 6.15   CATARACT EXTRACTION W/PHACO Left 10/02/2019   Procedure: CATARACT EXTRACTION PHACO AND INTRAOCULAR LENS PLACEMENT (IOC) CDE:;  Surgeon: Fabio Pierce, MD;  Location: [facility];  Service: Ophthalmology;  Laterality: Left;   CLOSED REDUCTION / MANIPULATION JOINT     COLONOSCOPY     REVERSE SHOULDER ARTHROPLASTY Right 06/25/2022   Procedure: REVERSE SHOULDER ARTHROPLASTY;  Surgeon: Oliver Barre, MD;  Location: [facility];  Service: Orthopedics;  Laterality: Right;   Patient Active Problem List   Diagnosis Date Noted   Closed 4-part fracture of proximal humerus 06/25/2022   Bilateral primary osteoarthritis of knee 02/02/2019   Body mass index 36.0-36.9, adult 02/02/2019   Blood in stool, frank 12/19/2018    ONSET DATE: 07/13/22  REFERRING DIAG: R Reverse Total Shoulder Surgery  THERAPY DIAG:  Acute pain of right  shoulder  Shoulder stiffness, right  Other symptoms and signs involving the musculoskeletal system  Rationale for Evaluation and Treatment: Rehabilitation  SUBJECTIVE:   SUBJECTIVE STATEMENT: S: I'm getting more functional with my movements.   PERTINENT HISTORY: Pt fell on 06/10/22 fracturing her humerus and injuring her shoulder. Overall pt has been in an immobilizer ever since.   PRECAUTIONS: Shoulder-see protocol  WEIGHT BEARING RESTRICTIONS: Yes No lifting  PAIN:  Are you having pain? Yes: NPRS scale: 5/10 Pain location: right shoulder Pain description: Soreness Aggravating factors: sling Relieving factors: medication  FALLS: Has patient fallen in last 6 months? Yes. Number of falls 1  PATIENT GOALS: To get use of her arm back  NEXT MD VISIT: 6 weeks  OBJECTIVE:   HAND DOMINANCE: Right  ADLs: Overall ADLs: Pt requiring assist with all ADL's due to being unable to move her arm, she is requring total assist for cleaning and most cooking tasks.   FUNCTIONAL OUTCOME MEASURES: FOTO: 29.81 08/31/22: 52.09  UPPER EXTREMITY ROM:       Assessed supine, er/IR adducted  Passive ROM In Supine Right eval Right 08/31/22  Shoulder flexion 113 129  Shoulder abduction 104 139  Shoulder internal rotation 90 90  Shoulder external rotation 49 45  Elbow flexion 140   Elbow extension -22   (Blank rows = not tested)  Active ROM In Seated Right 08/31/22  Shoulder flexion   Shoulder abduction   Shoulder internal rotation   Shoulder external rotation   Elbow flexion   Elbow extension   (Blank rows = not tested)  UPPER EXTREMITY MMT:     MMT Right eval  Shoulder flexion   Shoulder abduction   Shoulder internal rotation   Shoulder external rotation   (Blank rows = not tested)  OBSERVATIONS: Severe fasical restrictions along the biceps, trapezius, and axillary region.    TODAY'S TREATMENT:                                                                                                                               DATE:  09/17/22 -Manual Therapy: myofascial release and trigger point applied to the biceps, trapezius, and scapular region in order to reduce pain and fascial restrictions to improve ROM.  -A/ROM: sidelying, er/IR, protraction, flexion, horizontal abduction, abduction, x10 -A/ROM: supine: er/IR, protraction, flexion (90 degrees), abduction (90 degrees), horizontal abduction, x10 -Latissimus stretch 4x20" switching BUE each rep -Stretching: flexion, posterior capsule, bicep, er, doorway stretches, 3x15"  09/14/22 -AA/ROM: 3lb dowel, supine, flexion, abduction (min A), protraction, horizontal abduction, er/IR, x15 -A/ROM: sidelying, er/IR, protraction, flexion, horizontal abduction, abduction, x10 -A/ROM: seated: er/IR, protraction, flexion (60 degrees), abduction (60 degrees), x10 -Latissimus stretch 4x20" switching BUE each rep -Scapular Strengthening: Green band, extension, retraction, rows, x15 [employer] on the wall, flexion, x10  09/09/22 -Manual Therapy: myofascial release and trigger point applied to the biceps, trapezius, and scapular region in order to reduce pain and fascial restrictions to improve ROM.  -AA/ROM: 3lb dowel, supine, flexion, abduction (min A), protraction, horizontal abduction, er/IR, x12 -A/ROM: sidelying, er/IR, protraction, flexion, horizontal abduction, abduction, x10 -Wall Walks: flexion, x10 -Scapular Strengthening: red band, extension, rows, retraction, x10   PATIENT EDUCATION: Education details: Stretching (biceps, er, doorway, flexion) Person educated: Patient Education method: Explanation, Demonstration, and Handouts Education comprehension: verbalized understanding and returned demonstration  HOME EXERCISE PROGRAM: 3/7: Elbow and Wrist ROM, Pendulums 3/11: Table Slides 3/20: AA/ROM 4/1: Isometrics 4/3: Wall Slides 4/10: A/ROM 4/22: Scapular strengthening 4/25: Stretching (biceps, er, doorway,  flexion)   GOALS: Goals reviewed with patient? Yes  SHORT TERM GOALS: Target date: 08/28/22  Pt will be provided with and educated on HEP to improve mobility in RUE required for use during ADL completion.  Goal status: IN PROGRESS  2.  Pt will increase RUE P/ROM by 40 degrees or greater to improve ability to use RUE during dressing tasks with minimal compensatory techniques.  Goal status: IN PROGRESS  3.  Pt will increase RUE strength to 3+/5 to improve ability to reach for items at waist to chest height during bathing and grooming tasks.  Goal status: IN PROGRESS  LONG TERM GOALS: Target date: 09/25/22  Pt will decrease pain in RUE to 3/10 or less to improve ability to sleep for 2+ consecutive hours without waking due to pain.  Goal status: IN PROGRESS  2.  Pt will decrease RUE fascial restrictions to min amounts or less to improve mobility required for functional reaching tasks. Goal status: IN PROGRESS  3.   Pt will increase RUE A/ROM  to at least 145 for flexion/abduction, and 50 degrees er/IR to improve ability to use RUE when reaching overhead or behind back during dressing and bathing tasks.  Goal status: IN PROGRESS  4.  Pt will increase RUE strength to 4+/5 or greater to improve ability to use LUE when lifting or carrying items during meal preparation/housework/yardwork tasks. Goal status: IN PROGRESS  5.  Pt will return to highest level of function using RUE as dominant during functional task completion.  Goal status: IN PROGRESS  ASSESSMENT:  CLINICAL IMPRESSION: This session pt was able to complete side lying and supine A/ROM with improving ROM in supine. She is able to achieve approximately 50% of full A/ROM now in supine and 75-80% of full ROM in side lying. OT educated pt on stretching techniques to improve her ROM by reducing muscle tension. Verbal and tactile cuing provided for positioning and technique with all exercises.   PERFORMANCE DEFICITS: in functional  skills including ADLs, IADLs, edema, ROM, strength, pain, fascial restrictions, Gross motor control, body mechanics, and UE functional use.   PLAN:  OT FREQUENCY: 2x/week  OT DURATION: 8 weeks  PLANNED INTERVENTIONS: self care/ADL training, therapeutic exercise, therapeutic activity, manual therapy, scar mobilization, passive range of motion, functional mobility training, electrical stimulation, ultrasound, moist heat, cryotherapy, patient/family education, coping strategies training, and DME and/or AE instructions  CONSULTED AND AGREED WITH PLAN OF CARE: Patient  PLAN FOR NEXT SESSION: manual Therapy, AA/ROM, pulleys, add pvc pipe slide, isometrics, scap strengthening, A/ROM, Wall slides, shoulder strengthening   Trish Mage, OTR/L [phone] 09/20/2022, 9:22 PM

## 2022-09-20 ENCOUNTER — Other Ambulatory Visit: Payer: Self-pay | Admitting: Orthopedic Surgery

## 2022-09-22 ENCOUNTER — Ambulatory Visit (HOSPITAL_COMMUNITY): Payer: Medicare HMO | Admitting: Occupational Therapy

## 2022-09-22 ENCOUNTER — Encounter (HOSPITAL_COMMUNITY): Payer: Self-pay | Admitting: Occupational Therapy

## 2022-09-22 DIAGNOSIS — R29898 Other symptoms and signs involving the musculoskeletal system: Secondary | ICD-10-CM

## 2022-09-22 DIAGNOSIS — M25611 Stiffness of right shoulder, not elsewhere classified: Secondary | ICD-10-CM

## 2022-09-22 DIAGNOSIS — M25511 Pain in right shoulder: Secondary | ICD-10-CM | POA: Diagnosis not present

## 2022-09-22 NOTE — Therapy (Signed)
OUTPATIENT OCCUPATIONAL THERAPY ORTHO TREATMENT NOTE  Patient Name: April Murillo MRN: [id number] DOB:06/13/1950, 72 y.o., female Today's Date: 09/22/2022  PCP: Lia Hopping, MD REFERRING PROVIDER: Thane Edu, MD  END OF SESSION:  OT End of Session - 09/22/22 1120     Visit Number 16    Number of Visits 17    Date for OT Re-Evaluation 10/02/22    Authorization Type [employer]    Progress Note Due on Visit 10    OT Start Time 1118    OT Stop Time 1200    OT Time Calculation (min) 42 min    Activity Tolerance Patient tolerated treatment well    Behavior During Therapy WFL for tasks assessed/performed             Past Medical History:  Diagnosis Date   Arthritis    Diabetes mellitus without complication (HCC)    Hypercholesteremia    Hypertension    Past Surgical History:  Procedure Laterality Date   CATARACT EXTRACTION W/PHACO Right 09/18/2019   Procedure: CATARACT EXTRACTION PHACO AND INTRAOCULAR LENS PLACEMENT RIGHT EYE;  Surgeon: Fabio Pierce, MD;  Location: [facility];  Service: Ophthalmology;  Laterality: Right;  CDE: 6.15   CATARACT EXTRACTION W/PHACO Left 10/02/2019   Procedure: CATARACT EXTRACTION PHACO AND INTRAOCULAR LENS PLACEMENT (IOC) CDE:;  Surgeon: Fabio Pierce, MD;  Location: [facility];  Service: Ophthalmology;  Laterality: Left;   CLOSED REDUCTION / MANIPULATION JOINT     COLONOSCOPY     REVERSE SHOULDER ARTHROPLASTY Right 06/25/2022   Procedure: REVERSE SHOULDER ARTHROPLASTY;  Surgeon: Oliver Barre, MD;  Location: [facility];  Service: Orthopedics;  Laterality: Right;   Patient Active Problem List   Diagnosis Date Noted   Closed 4-part fracture of proximal humerus 06/25/2022   Bilateral primary osteoarthritis of knee 02/02/2019   Body mass index 36.0-36.9, adult 02/02/2019   Blood in stool, frank 12/19/2018    ONSET DATE: 07/13/22  REFERRING DIAG: R Reverse Total Shoulder Surgery  THERAPY DIAG:  Acute pain of right shoulder  Shoulder  stiffness, right  Other symptoms and signs involving the musculoskeletal system  Rationale for Evaluation and Treatment: Rehabilitation  SUBJECTIVE:   SUBJECTIVE STATEMENT: S: I think they put my muscles on wrong.   PERTINENT HISTORY: Pt fell on 06/10/22 fracturing her humerus and injuring her shoulder. Overall pt has been in an immobilizer ever since.   PRECAUTIONS: Shoulder-see protocol  WEIGHT BEARING RESTRICTIONS: Yes No lifting  PAIN:  Are you having pain? Yes: NPRS scale: 5/10 Pain location: right shoulder Pain description: Soreness Aggravating factors: sling Relieving factors: medication  FALLS: Has patient fallen in last 6 months? Yes. Number of falls 1  PATIENT GOALS: To get use of her arm back  NEXT MD VISIT: 6 weeks  OBJECTIVE:   HAND DOMINANCE: Right  ADLs: Overall ADLs: Pt requiring assist with all ADL's due to being unable to move her arm, she is requring total assist for cleaning and most cooking tasks.   FUNCTIONAL OUTCOME MEASURES: FOTO: 29.81 08/31/22: 52.09  UPPER EXTREMITY ROM:       Assessed supine, er/IR adducted  Passive ROM In Supine Right eval Right 08/31/22  Shoulder flexion 113 129  Shoulder abduction 104 139  Shoulder internal rotation 90 90  Shoulder external rotation 49 45  Elbow flexion 140   Elbow extension -22   (Blank rows = not tested)  Active ROM In Seated Right 08/31/22  Shoulder flexion   Shoulder abduction   Shoulder internal  rotation   Shoulder external rotation   Elbow flexion   Elbow extension   (Blank rows = not tested)  UPPER EXTREMITY MMT:     MMT Right eval  Shoulder flexion   Shoulder abduction   Shoulder internal rotation   Shoulder external rotation   (Blank rows = not tested)  OBSERVATIONS: Severe fasical restrictions along the biceps, trapezius, and axillary region.    TODAY'S TREATMENT:                                                                                                                               DATE:  09/22/22 -Manual Therapy: myofascial release and trigger point applied to the biceps, trapezius, and scapular region in order to reduce pain and fascial restrictions to improve ROM.  -A/ROM: sidelying, er/IR, protraction, flexion, horizontal abduction, abduction, x15 -A/ROM: supine: er/IR, protraction, flexion (90 degrees), abduction (90 degrees), horizontal abduction, x10 -Ball on the wall: vertical, horizontal, circles both directions, x10 -AA/ROM: 3lb dowel, seated, flexion, abduction, protraction, horizontal abduction, er/IR, x10  09/17/22 -Manual Therapy: myofascial release and trigger point applied to the biceps, trapezius, and scapular region in order to reduce pain and fascial restrictions to improve ROM.  -A/ROM: sidelying, er/IR, protraction, flexion, horizontal abduction, abduction, x10 -A/ROM: supine: er/IR, protraction, flexion (90 degrees), abduction (90 degrees), horizontal abduction, x10 -Latissimus stretch 4x20" switching BUE each rep -Stretching: flexion, posterior capsule, bicep, er, doorway stretches, 3x15"  09/14/22 -AA/ROM: 3lb dowel, supine, flexion, abduction (min A), protraction, horizontal abduction, er/IR, x15 -A/ROM: sidelying, er/IR, protraction, flexion, horizontal abduction, abduction, x10 -A/ROM: seated: er/IR, protraction, flexion (60 degrees), abduction (60 degrees), x10 -Latissimus stretch 4x20" switching BUE each rep -Scapular Strengthening: Green band, extension, retraction, rows, x15 -[facility] on the wall, flexion, x10   PATIENT EDUCATION: Education details: Stretching (biceps, er, doorway, flexion) Person educated: Patient Education method: Explanation, Demonstration, and Handouts Education comprehension: verbalized understanding and returned demonstration  HOME EXERCISE PROGRAM: 3/7: Elbow and Wrist ROM, Pendulums 3/11: Table Slides 3/20: AA/ROM 4/1: Isometrics 4/3: Wall Slides 4/10: A/ROM 4/22: Scapular  strengthening 4/25: Stretching (biceps, er, doorway, flexion)   GOALS: Goals reviewed with patient? Yes  SHORT TERM GOALS: Target date: 08/28/22  Pt will be provided with and educated on HEP to improve mobility in RUE required for use during ADL completion.  Goal status: IN PROGRESS  2.  Pt will increase RUE P/ROM by 40 degrees or greater to improve ability to use RUE during dressing tasks with minimal compensatory techniques.  Goal status: IN PROGRESS  3.  Pt will increase RUE strength to 3+/5 to improve ability to reach for items at waist to chest height during bathing and grooming tasks.  Goal status: IN PROGRESS  LONG TERM GOALS: Target date: 09/25/22  Pt will decrease pain in RUE to 3/10 or less to improve ability to sleep for 2+ consecutive hours without waking due to pain.  Goal status: IN PROGRESS  2.  Pt  will decrease RUE fascial restrictions to min amounts or less to improve mobility required for functional reaching tasks. Goal status: IN PROGRESS  3.   Pt will increase RUE A/ROM to at least 145 for flexion/abduction, and 50 degrees er/IR to improve ability to use RUE when reaching overhead or behind back during dressing and bathing tasks.  Goal status: IN PROGRESS  4.  Pt will increase RUE strength to 4+/5 or greater to improve ability to use LUE when lifting or carrying items during meal preparation/housework/yardwork tasks. Goal status: IN PROGRESS  5.  Pt will return to highest level of function using RUE as dominant during functional task completion.  Goal status: IN PROGRESS  ASSESSMENT:  CLINICAL IMPRESSION: This session Pt is continuing to work on ROM, with 75% of full ROM in side lying and approximately 45% of full ROM in sitting. She has pain when completing ball on the wall exercises in flexion as well as muscle fatigue. Pt is frustrated by her lack of movement in sitting and overall weakness. OT will start shoulder strengthening with theraband's next session to  initiate lower ROM strengthening. Verbal and tactile cuing for positioning and technique.   PERFORMANCE DEFICITS: in functional skills including ADLs, IADLs, edema, ROM, strength, pain, fascial restrictions, Gross motor control, body mechanics, and UE functional use.   PLAN:  OT FREQUENCY: 2x/week  OT DURATION: 8 weeks  PLANNED INTERVENTIONS: self care/ADL training, therapeutic exercise, therapeutic activity, manual therapy, scar mobilization, passive range of motion, functional mobility training, electrical stimulation, ultrasound, moist heat, cryotherapy, patient/family education, coping strategies training, and DME and/or AE instructions  CONSULTED AND AGREED WITH PLAN OF CARE: Patient  PLAN FOR NEXT SESSION: manual Therapy, AA/ROM, pulleys, add pvc pipe slide, isometrics, scap strengthening, A/ROM, Wall slides, shoulder strengthening   Trish Mage, OTR/L [phone] 09/22/2022, 11:21 AM

## 2022-09-24 ENCOUNTER — Encounter (HOSPITAL_COMMUNITY): Payer: Self-pay | Admitting: Occupational Therapy

## 2022-09-24 ENCOUNTER — Ambulatory Visit (HOSPITAL_COMMUNITY): Payer: Medicare HMO | Attending: Orthopedic Surgery | Admitting: Occupational Therapy

## 2022-09-24 DIAGNOSIS — M25511 Pain in right shoulder: Secondary | ICD-10-CM | POA: Insufficient documentation

## 2022-09-24 DIAGNOSIS — M25611 Stiffness of right shoulder, not elsewhere classified: Secondary | ICD-10-CM

## 2022-09-24 DIAGNOSIS — R29898 Other symptoms and signs involving the musculoskeletal system: Secondary | ICD-10-CM | POA: Insufficient documentation

## 2022-09-24 NOTE — Therapy (Signed)
OUTPATIENT OCCUPATIONAL THERAPY ORTHO TREATMENT NOTE REASSESSMENT AND RECERTIFICATION  Patient Name: April Murillo MRN: [id number] DOB:1950/10/26, 72 y.o., female Today's Date: 09/24/2022  PCP: Lia Hopping, MD REFERRING PROVIDER: Thane Edu, MD  Progress Note Reporting Period 07/30/22 to 09/24/22  See note below for Objective Data and Assessment of Progress/Goals.    END OF SESSION:  OT End of Session - 09/24/22 1056     Visit Number 17    Number of Visits 17    Date for OT Re-Evaluation 10/02/22    Authorization Type [employer]    Progress Note Due on Visit 10    OT Start Time 1120    OT Stop Time 1200    OT Time Calculation (min) 40 min    Activity Tolerance Patient tolerated treatment well    Behavior During Therapy WFL for tasks assessed/performed             Past Medical History:  Diagnosis Date   Arthritis    Diabetes mellitus without complication (HCC)    Hypercholesteremia    Hypertension    Past Surgical History:  Procedure Laterality Date   CATARACT EXTRACTION W/PHACO Right 09/18/2019   Procedure: CATARACT EXTRACTION PHACO AND INTRAOCULAR LENS PLACEMENT RIGHT EYE;  Surgeon: Fabio Pierce, MD;  Location: [facility];  Service: Ophthalmology;  Laterality: Right;  CDE: 6.15   CATARACT EXTRACTION W/PHACO Left 10/02/2019   Procedure: CATARACT EXTRACTION PHACO AND INTRAOCULAR LENS PLACEMENT (IOC) CDE:;  Surgeon: Fabio Pierce, MD;  Location: [facility];  Service: Ophthalmology;  Laterality: Left;   CLOSED REDUCTION / MANIPULATION JOINT     COLONOSCOPY     REVERSE SHOULDER ARTHROPLASTY Right 06/25/2022   Procedure: REVERSE SHOULDER ARTHROPLASTY;  Surgeon: Oliver Barre, MD;  Location: [facility];  Service: Orthopedics;  Laterality: Right;   Patient Active Problem List   Diagnosis Date Noted   Closed 4-part fracture of proximal humerus 06/25/2022   Bilateral primary osteoarthritis of knee 02/02/2019   Body mass index 36.0-36.9, adult 02/02/2019   Blood in stool,  frank 12/19/2018    ONSET DATE: 07/13/22  REFERRING DIAG: R Reverse Total Shoulder Surgery  THERAPY DIAG:  Acute pain of right shoulder  Shoulder stiffness, right  Other symptoms and signs involving the musculoskeletal system  Rationale for Evaluation and Treatment: Rehabilitation  SUBJECTIVE:   SUBJECTIVE STATEMENT: S: I just can't lift my arm.   PERTINENT HISTORY: Pt fell on 06/10/22 fracturing her humerus and injuring her shoulder. Overall pt has been in an immobilizer ever since.   PRECAUTIONS: Shoulder-see protocol  WEIGHT BEARING RESTRICTIONS: Yes No lifting  PAIN:  Are you having pain? Yes: NPRS scale: 4/10 Pain location: right shoulder Pain description: Soreness Aggravating factors: sling Relieving factors: medication  FALLS: Has patient fallen in last 6 months? Yes. Number of falls 1  PATIENT GOALS: To get use of her arm back  NEXT MD VISIT: 6 weeks  OBJECTIVE:   HAND DOMINANCE: Right  ADLs: Overall ADLs: Pt requiring assist with all ADL's due to being unable to move her arm, she is requring total assist for cleaning and most cooking tasks.   FUNCTIONAL OUTCOME MEASURES: FOTO: 29.81 08/31/22: 52.09  UPPER EXTREMITY ROM:       Assessed supine, er/IR adducted  Passive ROM In Supine Right eval Right 08/31/22  Shoulder flexion 113 129  Shoulder abduction 104 139  Shoulder internal rotation 90 90  Shoulder external rotation 49 45  Elbow flexion 140   Elbow extension -22   (Blank  rows = not tested)  Active ROM In Seated Right 09/24/22  Shoulder flexion 68  Shoulder abduction 59  Shoulder internal rotation 90  Shoulder external rotation 36  Elbow flexion 137  Elbow extension 0  (Blank rows = not tested)  UPPER EXTREMITY MMT:     MMT Right eval Right 09/24/22  Shoulder flexion  3+/5  Shoulder abduction  3/5  Shoulder internal rotation  4-/5  Shoulder external rotation  3/5  (Blank rows = not tested)  OBSERVATIONS: Severe fasical  restrictions along the biceps, trapezius, and axillary region.    TODAY'S TREATMENT:                                                                                                                              DATE:  09/24/22 -Manual Therapy: myofascial release and trigger point applied to the biceps, trapezius, and scapular region in order to reduce pain and fascial restrictions to improve ROM.  -AA/ROM: 5lb dowel, seated, flexion, abduction, protraction, horizontal abduction, er/IR, x10 -A/ROM: seated, flexion, abduction, protraction, horizontal abduction, er/IR, x15 -Wall Climbs -Measurements for Reassessment  09/22/22 -Manual Therapy: myofascial release and trigger point applied to the biceps, trapezius, and scapular region in order to reduce pain and fascial restrictions to improve ROM.  -A/ROM: sidelying, er/IR, protraction, flexion, horizontal abduction, abduction, x15 -A/ROM: supine: er/IR, protraction, flexion (90 degrees), abduction (90 degrees), horizontal abduction, x10 -Ball on the wall: vertical, horizontal, circles both directions, x10 -AA/ROM: 3lb dowel, seated, flexion, abduction, protraction, horizontal abduction, er/IR, x10  09/17/22 -Manual Therapy: myofascial release and trigger point applied to the biceps, trapezius, and scapular region in order to reduce pain and fascial restrictions to improve ROM.  -A/ROM: sidelying, er/IR, protraction, flexion, horizontal abduction, abduction, x10 -A/ROM: supine: er/IR, protraction, flexion (90 degrees), abduction (90 degrees), horizontal abduction, x10 -Latissimus stretch 4x20" switching BUE each rep -Stretching: flexion, posterior capsule, bicep, er, doorway stretches, 3x15"   PATIENT EDUCATION: Education details: Stretching (biceps, er, doorway, flexion) Person educated: Patient Education method: Explanation, Demonstration, and Handouts Education comprehension: verbalized understanding and returned demonstration  HOME  EXERCISE PROGRAM: 3/7: Elbow and Wrist ROM, Pendulums 3/11: Table Slides 3/20: AA/ROM 4/1: Isometrics 4/3: Wall Slides 4/10: A/ROM 4/22: Scapular strengthening 4/25: Stretching (biceps, er, doorway, flexion)   GOALS: Goals reviewed with patient? Yes  SHORT TERM GOALS: Target date: 08/28/22  Pt will be provided with and educated on HEP to improve mobility in RUE required for use during ADL completion.  Goal status: IN PROGRESS  2.  Pt will increase RUE P/ROM by 40 degrees or greater to improve ability to use RUE during dressing tasks with minimal compensatory techniques.  Goal status: IN PROGRESS  3.  Pt will increase RUE strength to 3+/5 to improve ability to reach for items at waist to chest height during bathing and grooming tasks.  Goal status: IN PROGRESS  LONG TERM GOALS: Target date: 09/25/22  Pt will decrease pain in RUE to 3/10  or less to improve ability to sleep for 2+ consecutive hours without waking due to pain.  Goal status: IN PROGRESS  2.  Pt will decrease RUE fascial restrictions to min amounts or less to improve mobility required for functional reaching tasks. Goal status: IN PROGRESS  3.   Pt will increase RUE A/ROM to at least 145 for flexion/abduction, and 50 degrees er/IR to improve ability to use RUE when reaching overhead or behind back during dressing and bathing tasks.  Goal status: IN PROGRESS  4.  Pt will increase RUE strength to 4+/5 or greater to improve ability to use LUE when lifting or carrying items during meal preparation/housework/yardwork tasks. Goal status: IN PROGRESS  5.  Pt will return to highest level of function using RUE as dominant during functional task completion.  Goal status: IN PROGRESS  ASSESSMENT:  CLINICAL IMPRESSION: Pt completed reassessment this session for her progress note. She continues to have limited ROM in sitting, however her supine ROM and strength are improving. She continues to report feeling limited with IADL's  due to overall ROM and pain with reaching. The remainder of the session pt worked on strengthening and ROM, with wall climbs to assist with making her movements more smooth. Verbal and tactile cuing provided for positioning and technique during all exercises.   PERFORMANCE DEFICITS: in functional skills including ADLs, IADLs, edema, ROM, strength, pain, fascial restrictions, Gross motor control, body mechanics, and UE functional use.   PLAN:  OT FREQUENCY: 2x/week  OT DURATION: 8 weeks  PLANNED INTERVENTIONS: self care/ADL training, therapeutic exercise, therapeutic activity, manual therapy, scar mobilization, passive range of motion, functional mobility training, electrical stimulation, ultrasound, moist heat, cryotherapy, patient/family education, coping strategies training, and DME and/or AE instructions  CONSULTED AND AGREED WITH PLAN OF CARE: Patient  PLAN FOR NEXT SESSION: manual Therapy, AA/ROM, pulleys, add pvc pipe slide, isometrics, scap strengthening, A/ROM, Wall slides, shoulder strengthening   Trish Mage, OTR/L [phone] 09/24/2022, 10:59 AM

## 2022-10-06 ENCOUNTER — Ambulatory Visit (HOSPITAL_COMMUNITY): Payer: Medicare HMO | Admitting: Occupational Therapy

## 2022-10-06 ENCOUNTER — Encounter (HOSPITAL_COMMUNITY): Payer: Self-pay | Admitting: Occupational Therapy

## 2022-10-06 DIAGNOSIS — M25511 Pain in right shoulder: Secondary | ICD-10-CM

## 2022-10-06 DIAGNOSIS — R29898 Other symptoms and signs involving the musculoskeletal system: Secondary | ICD-10-CM

## 2022-10-06 DIAGNOSIS — M25611 Stiffness of right shoulder, not elsewhere classified: Secondary | ICD-10-CM | POA: Diagnosis not present

## 2022-10-06 NOTE — Therapy (Signed)
OUTPATIENT OCCUPATIONAL THERAPY ORTHO TREATMENT NOTE  Patient Name: BELL BARSANTI MRN: [id number] DOB:12-30-1950, 72 y.o., female Today's Date: 10/06/2022  PCP: Lia Hopping, MD REFERRING PROVIDER: Thane Edu, MD   END OF SESSION:  OT End of Session - 10/06/22 1113     Visit Number 18    Number of Visits 25    Date for OT Re-Evaluation 11/05/22    Authorization Type [employer]    Progress Note Due on Visit 10    OT Start Time 1030    OT Stop Time 1114    OT Time Calculation (min) 44 min    Activity Tolerance Patient tolerated treatment well    Behavior During Therapy WFL for tasks assessed/performed              Past Medical History:  Diagnosis Date   Arthritis    Diabetes mellitus without complication (HCC)    Hypercholesteremia    Hypertension    Past Surgical History:  Procedure Laterality Date   CATARACT EXTRACTION W/PHACO Right 09/18/2019   Procedure: CATARACT EXTRACTION PHACO AND INTRAOCULAR LENS PLACEMENT RIGHT EYE;  Surgeon: Fabio Pierce, MD;  Location: [facility];  Service: Ophthalmology;  Laterality: Right;  CDE: 6.15   CATARACT EXTRACTION W/PHACO Left 10/02/2019   Procedure: CATARACT EXTRACTION PHACO AND INTRAOCULAR LENS PLACEMENT (IOC) CDE:;  Surgeon: Fabio Pierce, MD;  Location: [facility];  Service: Ophthalmology;  Laterality: Left;   CLOSED REDUCTION / MANIPULATION JOINT     COLONOSCOPY     REVERSE SHOULDER ARTHROPLASTY Right 06/25/2022   Procedure: REVERSE SHOULDER ARTHROPLASTY;  Surgeon: Oliver Barre, MD;  Location: [facility];  Service: Orthopedics;  Laterality: Right;   Patient Active Problem List   Diagnosis Date Noted   Closed 4-part fracture of proximal humerus 06/25/2022   Bilateral primary osteoarthritis of knee 02/02/2019   Body mass index 36.0-36.9, adult 02/02/2019   Blood in stool, frank 12/19/2018    ONSET DATE: 07/13/22  REFERRING DIAG: R Reverse Total Shoulder Surgery  THERAPY DIAG:  Acute pain of right shoulder - Plan: Ot  plan of care cert/re-cert  Shoulder stiffness, right - Plan: Ot plan of care cert/re-cert  Other symptoms and signs involving the musculoskeletal system - Plan: Ot plan of care cert/re-cert  Rationale for Evaluation and Treatment: Rehabilitation  SUBJECTIVE:   SUBJECTIVE STATEMENT: S: "I was sore yesterday from the way I laid on it."   PERTINENT HISTORY: Pt fell on 06/10/22 fracturing her humerus and injuring her shoulder. Overall pt has been in an immobilizer ever since.   PRECAUTIONS: Shoulder-see protocol  WEIGHT BEARING RESTRICTIONS: Yes No lifting  PAIN:  Are you having pain? No  FALLS: Has patient fallen in last 6 months? Yes. Number of falls 1  PATIENT GOALS: To get use of her arm back  NEXT MD VISIT: 6 weeks  OBJECTIVE:   HAND DOMINANCE: Right  ADLs: Overall ADLs: Pt requiring assist with all ADL's due to being unable to move her arm, she is requring total assist for cleaning and most cooking tasks.   FUNCTIONAL OUTCOME MEASURES: FOTO: 29.81 08/31/22: 52.09 10/06/22: 51/100  UPPER EXTREMITY ROM:       Assessed supine, er/IR adducted  Passive ROM In Supine Right eval Right 08/31/22  Shoulder flexion 113 129  Shoulder abduction 104 139  Shoulder internal rotation 90 90  Shoulder external rotation 49 45  Elbow flexion 140   Elbow extension -22   (Blank rows = not tested)  Active ROM In Seated Right  09/24/22  Shoulder flexion 68  Shoulder abduction 59  Shoulder internal rotation 90  Shoulder external rotation 36  Elbow flexion 137  Elbow extension 0  (Blank rows = not tested)  UPPER EXTREMITY MMT:     MMT Right eval Right 09/24/22  Shoulder flexion  3+/5  Shoulder abduction  3/5  Shoulder internal rotation  4-/5  Shoulder external rotation  3/5  (Blank rows = not tested)  OBSERVATIONS: Severe fasical restrictions along the biceps, trapezius, and axillary region.    TODAY'S TREATMENT:                                                                                                                               DATE:   10/06/22 -Manual Therapy: myofascial release and trigger point applied to the biceps, trapezius, and scapular region in order to reduce pain and fascial restrictions to improve ROM.  -Shoulder strengthening: supine-protraction, flexion, er/IR, abduction, horizontal abduction, 1#, 12 reps -Proximal shoulder strengthening: supine-paddles, criss cross, circles each direction, 1#, 10 reps each -Ball on wall: 1' flexion, green ball -Overhead lacing: seated, lacing from top down then reversing -Shoulder strengthening: red theraband-protraction, 5 reps -UBE: level 1, 2' forward, 2' reverse, pace: 5.0  09/24/22 -Manual Therapy: myofascial release and trigger point applied to the biceps, trapezius, and scapular region in order to reduce pain and fascial restrictions to improve ROM.  -AA/ROM: 5lb dowel, seated, flexion, abduction, protraction, horizontal abduction, er/IR, x10 -A/ROM: seated, flexion, abduction, protraction, horizontal abduction, er/IR, x15 -Wall Climbs -Measurements for Reassessment  09/22/22 -Manual Therapy: myofascial release and trigger point applied to the biceps, trapezius, and scapular region in order to reduce pain and fascial restrictions to improve ROM.  -A/ROM: sidelying, er/IR, protraction, flexion, horizontal abduction, abduction, x15 -A/ROM: supine: er/IR, protraction, flexion (90 degrees), abduction (90 degrees), horizontal abduction, x10 -Ball on the wall: vertical, horizontal, circles both directions, x10 -AA/ROM: 3lb dowel, seated, flexion, abduction, protraction, horizontal abduction, er/IR, x10  09/17/22 -Manual Therapy: myofascial release and trigger point applied to the biceps, trapezius, and scapular region in order to reduce pain and fascial restrictions to improve ROM.  -A/ROM: sidelying, er/IR, protraction, flexion, horizontal abduction, abduction, x10 -A/ROM: supine: er/IR, protraction,  flexion (90 degrees), abduction (90 degrees), horizontal abduction, x10 -Latissimus stretch 4x20" switching BUE each rep -Stretching: flexion, posterior capsule, bicep, er, doorway stretches, 3x15"   PATIENT EDUCATION: Education details: Discussed HEP Person educated: Patient Education method: Explanation, Demonstration, and Handouts Education comprehension: verbalized understanding and returned demonstration  HOME EXERCISE PROGRAM: 3/7: Elbow and Wrist ROM, Pendulums 3/11: Table Slides 3/20: AA/ROM 4/1: Isometrics 4/3: Wall Slides 4/10: A/ROM 4/22: Scapular strengthening 4/25: Stretching (biceps, er, doorway, flexion)   GOALS: Goals reviewed with patient? Yes  SHORT TERM GOALS: Target date: 08/28/22  Pt will be provided with and educated on HEP to improve mobility in RUE required for use during ADL completion.  Goal status: IN PROGRESS  2.  Pt will increase RUE  P/ROM by 40 degrees or greater to improve ability to use RUE during dressing tasks with minimal compensatory techniques.  Goal status: IN PROGRESS  3.  Pt will increase RUE strength to 3+/5 to improve ability to reach for items at waist to chest height during bathing and grooming tasks.  Goal status: IN PROGRESS  LONG TERM GOALS: Target date: 09/25/22  Pt will decrease pain in RUE to 3/10 or less to improve ability to sleep for 2+ consecutive hours without waking due to pain.  Goal status: IN PROGRESS  2.  Pt will decrease RUE fascial restrictions to min amounts or less to improve mobility required for functional reaching tasks. Goal status: IN PROGRESS  3.   Pt will increase RUE A/ROM to at least 145 for flexion/abduction, and 50 degrees er/IR to improve ability to use RUE when reaching overhead or behind back during dressing and bathing tasks.  Goal status: IN PROGRESS  4.  Pt will increase RUE strength to 4+/5 or greater to improve ability to use LUE when lifting or carrying items during meal  preparation/housework/yardwork tasks. Goal status: IN PROGRESS  5.  Pt will return to highest level of function using RUE as dominant during functional task completion.  Goal status: IN PROGRESS  ASSESSMENT:  CLINICAL IMPRESSION: Pt reports difficulty gaining full ROM when completing AA/ROM at home in standing. Discussed strengthening in supine that was added today and goal of this carrying over to improved strength against gravity. Continued with manual techniques, progressed to strengthening in standing. Continued with ball on wall and added overhead lacing, noting trapezius activation and mod difficulty reaching above shoulder height. Limited activity tolerance with new exercises. Attempted red theraband strengthening however pt easily fatigued with impact on form therefore discontinued.   PERFORMANCE DEFICITS: in functional skills including ADLs, IADLs, edema, ROM, strength, pain, fascial restrictions, Gross motor control, body mechanics, and UE functional use.   PLAN:  OT FREQUENCY: 2x/week  OT DURATION: 8 weeks  PLANNED INTERVENTIONS: self care/ADL training, therapeutic exercise, therapeutic activity, manual therapy, scar mobilization, passive range of motion, functional mobility training, electrical stimulation, ultrasound, moist heat, cryotherapy, patient/family education, coping strategies training, and DME and/or AE instructions  CONSULTED AND AGREED WITH PLAN OF CARE: Patient  PLAN FOR NEXT SESSION: manual Therapy, continue with strengthening in supine, AA/ROM in standing, proximal shoulder strengthening   Ezra Sites, OTR/L  [phone] 10/06/2022, 11:57 AM

## 2022-10-08 ENCOUNTER — Encounter (HOSPITAL_COMMUNITY): Payer: Medicare HMO | Admitting: Occupational Therapy

## 2022-10-13 ENCOUNTER — Ambulatory Visit (HOSPITAL_COMMUNITY): Payer: Medicare HMO | Admitting: Occupational Therapy

## 2022-10-13 DIAGNOSIS — M25611 Stiffness of right shoulder, not elsewhere classified: Secondary | ICD-10-CM | POA: Diagnosis not present

## 2022-10-13 DIAGNOSIS — M25511 Pain in right shoulder: Secondary | ICD-10-CM

## 2022-10-13 DIAGNOSIS — R29898 Other symptoms and signs involving the musculoskeletal system: Secondary | ICD-10-CM

## 2022-10-13 NOTE — Therapy (Signed)
OUTPATIENT OCCUPATIONAL THERAPY ORTHO TREATMENT NOTE  Patient Name: April Murillo MRN: [id number] DOB:[birth year], 72 y.o., female Today's Date: 10/13/2022  PCP: Lia Hopping, MD REFERRING PROVIDER: Thane Edu, MD   END OF SESSION:  OT End of Session - 10/13/22 1119     Visit Number 19    Number of Visits 25    Date for OT Re-Evaluation 11/05/22    Authorization Type [employer]    Progress Note Due on Visit 10    OT Start Time 1118    OT Stop Time 1200    OT Time Calculation (min) 42 min    Activity Tolerance Patient tolerated treatment well    Behavior During Therapy WFL for tasks assessed/performed              Past Medical History:  Diagnosis Date   Arthritis    Diabetes mellitus without complication (HCC)    Hypercholesteremia    Hypertension    Past Surgical History:  Procedure Laterality Date   CATARACT EXTRACTION W/PHACO Right 09/18/2019   Procedure: CATARACT EXTRACTION PHACO AND INTRAOCULAR LENS PLACEMENT RIGHT EYE;  Surgeon: Fabio Pierce, MD;  Location: [facility];  Service: Ophthalmology;  Laterality: Right;  CDE: 6.15   CATARACT EXTRACTION W/PHACO Left 10/02/2019   Procedure: CATARACT EXTRACTION PHACO AND INTRAOCULAR LENS PLACEMENT (IOC) CDE:;  Surgeon: Fabio Pierce, MD;  Location: [facility];  Service: Ophthalmology;  Laterality: Left;   CLOSED REDUCTION / MANIPULATION JOINT     COLONOSCOPY     REVERSE SHOULDER ARTHROPLASTY Right 06/25/2022   Procedure: REVERSE SHOULDER ARTHROPLASTY;  Surgeon: Oliver Barre, MD;  Location: [facility];  Service: Orthopedics;  Laterality: Right;   Patient Active Problem List   Diagnosis Date Noted   Closed 4-part fracture of proximal humerus 06/25/2022   Bilateral primary osteoarthritis of knee 02/02/2019   Body mass index 36.0-36.9, adult 02/02/2019   Blood in stool, frank 12/19/2018    ONSET DATE: 07/13/22  REFERRING DIAG: R Reverse Total Shoulder Surgery  THERAPY DIAG:  Acute pain of right shoulder  Shoulder  stiffness, right  Other symptoms and signs involving the musculoskeletal system  Rationale for Evaluation and Treatment: Rehabilitation  SUBJECTIVE:   SUBJECTIVE STATEMENT: S: "I was sore yesterday from the way I laid on it."   PERTINENT HISTORY: Pt fell on 06/10/22 fracturing her humerus and injuring her shoulder. Overall pt has been in an immobilizer ever since.   PRECAUTIONS: Shoulder-see protocol  WEIGHT BEARING RESTRICTIONS: Yes No lifting  PAIN:  Are you having pain? No  FALLS: Has patient fallen in last 6 months? Yes. Number of falls 1  PATIENT GOALS: To get use of her arm back  NEXT MD VISIT: 6 weeks  OBJECTIVE:   HAND DOMINANCE: Right  ADLs: Overall ADLs: Pt requiring assist with all ADL's due to being unable to move her arm, she is requring total assist for cleaning and most cooking tasks.   FUNCTIONAL OUTCOME MEASURES: FOTO: 29.81 08/31/22: 52.09 10/06/22: 51/100  UPPER EXTREMITY ROM:       Assessed supine, er/IR adducted  Passive ROM In Supine Right eval Right 08/31/22  Shoulder flexion 113 129  Shoulder abduction 104 139  Shoulder internal rotation 90 90  Shoulder external rotation 49 45  Elbow flexion 140   Elbow extension -22   (Blank rows = not tested)  Active ROM In Seated Right 09/24/22  Shoulder flexion 68  Shoulder abduction 59  Shoulder internal rotation 90  Shoulder external rotation 36  Elbow  flexion 137  Elbow extension 0  (Blank rows = not tested)  UPPER EXTREMITY MMT:     MMT Right eval Right 09/24/22  Shoulder flexion  3+/5  Shoulder abduction  3/5  Shoulder internal rotation  4-/5  Shoulder external rotation  3/5  (Blank rows = not tested)  OBSERVATIONS: Severe fasical restrictions along the biceps, trapezius, and axillary region.    TODAY'S TREATMENT:                                                                                                                              DATE:   10/13/22 -Manual Therapy:  myofascial release and trigger point applied to the biceps, trapezius, and scapular region in order to reduce pain and fascial restrictions to improve ROM.  -A/ROM: supine: flexion, abduction, protraction, horizontal abduction, er/IR, x15 -Shoulder Strengthening: 2lbs seated, flexion, abduction, protraction, horizontal abduction, er/IR, x10 -Proximal shoulder strengthening: supine-paddles, criss cross, circles each direction, 1#, x60" -Ball on the Wall: ABC's  10/06/22 -Manual Therapy: myofascial release and trigger point applied to the biceps, trapezius, and scapular region in order to reduce pain and fascial restrictions to improve ROM.  -Shoulder strengthening: supine-protraction, flexion, er/IR, abduction, horizontal abduction, 1#, 12 reps -Proximal shoulder strengthening: supine-paddles, criss cross, circles each direction, 1#, 10 reps each -Ball on wall: 1' flexion, green ball -Overhead lacing: seated, lacing from top down then reversing -Shoulder strengthening: red theraband-protraction, 5 reps -UBE: level 1, 2' forward, 2' reverse, pace: 5.0  09/24/22 -Manual Therapy: myofascial release and trigger point applied to the biceps, trapezius, and scapular region in order to reduce pain and fascial restrictions to improve ROM.  -AA/ROM: 5lb dowel, seated, flexion, abduction, protraction, horizontal abduction, er/IR, x10 -A/ROM: seated, flexion, abduction, protraction, horizontal abduction, er/IR, x15 -Wall Climbs -Measurements for Reassessment   PATIENT EDUCATION: Education details: Public relations account executive (dumbbells) Person educated: Patient Education method: Explanation, Demonstration, and Handouts Education comprehension: verbalized understanding and returned demonstration  HOME EXERCISE PROGRAM: 3/7: Elbow and Wrist ROM, Pendulums 3/11: Table Slides 3/20: AA/ROM 4/1: Isometrics 4/3: Wall Slides 4/10: A/ROM 4/22: Scapular strengthening 4/25: Stretching (biceps, er, doorway,  flexion) 5/21: Shoulder Strengthening (dumbbells)   GOALS: Goals reviewed with patient? Yes  SHORT TERM GOALS: Target date: 08/28/22  Pt will be provided with and educated on HEP to improve mobility in RUE required for use during ADL completion.  Goal status: IN PROGRESS  2.  Pt will increase RUE P/ROM by 40 degrees or greater to improve ability to use RUE during dressing tasks with minimal compensatory techniques.  Goal status: IN PROGRESS  3.  Pt will increase RUE strength to 3+/5 to improve ability to reach for items at waist to chest height during bathing and grooming tasks.  Goal status: IN PROGRESS  LONG TERM GOALS: Target date: 09/25/22  Pt will decrease pain in RUE to 3/10 or less to improve ability to sleep for 2+ consecutive hours without waking due to pain.  Goal  status: IN PROGRESS  2.  Pt will decrease RUE fascial restrictions to min amounts or less to improve mobility required for functional reaching tasks. Goal status: IN PROGRESS  3.   Pt will increase RUE A/ROM to at least 145 for flexion/abduction, and 50 degrees er/IR to improve ability to use RUE when reaching overhead or behind back during dressing and bathing tasks.  Goal status: IN PROGRESS  4.  Pt will increase RUE strength to 4+/5 or greater to improve ability to use LUE when lifting or carrying items during meal preparation/housework/yardwork tasks. Goal status: IN PROGRESS  5.  Pt will return to highest level of function using RUE as dominant during functional task completion.  Goal status: IN PROGRESS  ASSESSMENT:  CLINICAL IMPRESSION: This session pt was able to achieve 80% of full A/ROM in supine. She reported minimal pain this session in her shoulder, however she has quick muscle fatigue with endurance activities. This session OT progressed her to 2lb dumbbells for shoulder strengthening, which she had decreased control, however was able to achieve shoulder height throughout. Verbal and tactile cuing  provided for positioning and technique throughout all exercises.   PERFORMANCE DEFICITS: in functional skills including ADLs, IADLs, edema, ROM, strength, pain, fascial restrictions, Gross motor control, body mechanics, and UE functional use.   PLAN:  OT FREQUENCY: 2x/week  OT DURATION: 8 weeks  PLANNED INTERVENTIONS: self care/ADL training, therapeutic exercise, therapeutic activity, manual therapy, scar mobilization, passive range of motion, functional mobility training, electrical stimulation, ultrasound, moist heat, cryotherapy, patient/family education, coping strategies training, and DME and/or AE instructions  CONSULTED AND AGREED WITH PLAN OF CARE: Patient  PLAN FOR NEXT SESSION: manual Therapy, continue with strengthening in supine, AA/ROM in standing, proximal shoulder strengthening   Trish Mage, OTR/L [phone] 10/13/2022, 11:20 AM

## 2022-10-14 ENCOUNTER — Other Ambulatory Visit: Payer: Self-pay | Admitting: Orthopedic Surgery

## 2022-10-15 ENCOUNTER — Ambulatory Visit (HOSPITAL_COMMUNITY): Payer: Medicare HMO | Admitting: Occupational Therapy

## 2022-10-15 ENCOUNTER — Encounter (HOSPITAL_COMMUNITY): Payer: Self-pay | Admitting: Occupational Therapy

## 2022-10-15 DIAGNOSIS — M25611 Stiffness of right shoulder, not elsewhere classified: Secondary | ICD-10-CM

## 2022-10-15 DIAGNOSIS — R29898 Other symptoms and signs involving the musculoskeletal system: Secondary | ICD-10-CM | POA: Diagnosis not present

## 2022-10-15 DIAGNOSIS — M25511 Pain in right shoulder: Secondary | ICD-10-CM | POA: Diagnosis not present

## 2022-10-15 NOTE — Therapy (Signed)
OUTPATIENT OCCUPATIONAL THERAPY ORTHO TREATMENT NOTE  Patient Name: April Murillo MRN: [id number] DOB:1950/08/18, 72 y.o., female Today's Date: 10/15/2022  PCP: Lia Hopping, MD REFERRING PROVIDER: Thane Edu, MD   END OF SESSION:  OT End of Session - 10/15/22 1202     Visit Number 20    Number of Visits 25    Date for OT Re-Evaluation 11/05/22    Authorization Type [employer]    Progress Note Due on Visit 10    OT Start Time 1032    OT Stop Time 1111    OT Time Calculation (min) 39 min    Activity Tolerance Patient tolerated treatment well    Behavior During Therapy WFL for tasks assessed/performed               Past Medical History:  Diagnosis Date   Arthritis    Diabetes mellitus without complication (HCC)    Hypercholesteremia    Hypertension    Past Surgical History:  Procedure Laterality Date   CATARACT EXTRACTION W/PHACO Right 09/18/2019   Procedure: CATARACT EXTRACTION PHACO AND INTRAOCULAR LENS PLACEMENT RIGHT EYE;  Surgeon: Fabio Pierce, MD;  Location: [facility];  Service: Ophthalmology;  Laterality: Right;  CDE: 6.15   CATARACT EXTRACTION W/PHACO Left 10/02/2019   Procedure: CATARACT EXTRACTION PHACO AND INTRAOCULAR LENS PLACEMENT (IOC) CDE:;  Surgeon: Fabio Pierce, MD;  Location: [facility];  Service: Ophthalmology;  Laterality: Left;   CLOSED REDUCTION / MANIPULATION JOINT     COLONOSCOPY     REVERSE SHOULDER ARTHROPLASTY Right 06/25/2022   Procedure: REVERSE SHOULDER ARTHROPLASTY;  Surgeon: Oliver Barre, MD;  Location: [facility];  Service: Orthopedics;  Laterality: Right;   Patient Active Problem List   Diagnosis Date Noted   Closed 4-part fracture of proximal humerus 06/25/2022   Bilateral primary osteoarthritis of knee 02/02/2019   Body mass index 36.0-36.9, adult 02/02/2019   Blood in stool, frank 12/19/2018    ONSET DATE: 07/13/22  REFERRING DIAG: R Reverse Total Shoulder Surgery  THERAPY DIAG:  Acute pain of right shoulder  Shoulder  stiffness, right  Other symptoms and signs involving the musculoskeletal system  Rationale for Evaluation and Treatment: Rehabilitation  SUBJECTIVE:   SUBJECTIVE STATEMENT: S: "Everything is going good."   PERTINENT HISTORY: Pt fell on 06/10/22 fracturing her humerus and injuring her shoulder. Overall pt has been in an immobilizer ever since.   PRECAUTIONS: Shoulder-see protocol  WEIGHT BEARING RESTRICTIONS: Yes No lifting  PAIN:  Are you having pain? No  FALLS: Has patient fallen in last 6 months? Yes. Number of falls 1  PATIENT GOALS: To get use of her arm back  NEXT MD VISIT: 6 weeks  OBJECTIVE:   HAND DOMINANCE: Right  ADLs: Overall ADLs: Pt requiring assist with all ADL's due to being unable to move her arm, she is requring total assist for cleaning and most cooking tasks.   FUNCTIONAL OUTCOME MEASURES: FOTO: 29.81 08/31/22: 52.09 10/06/22: 51/100  UPPER EXTREMITY ROM:       Assessed supine, er/IR adducted  Passive ROM In Supine Right eval Right 08/31/22  Shoulder flexion 113 129  Shoulder abduction 104 139  Shoulder internal rotation 90 90  Shoulder external rotation 49 45  Elbow flexion 140   Elbow extension -22   (Blank rows = not tested)  Active ROM In Seated Right 09/24/22  Shoulder flexion 68  Shoulder abduction 59  Shoulder internal rotation 90  Shoulder external rotation 36  Elbow flexion 137  Elbow extension 0  (  Blank rows = not tested)  UPPER EXTREMITY MMT:     MMT Right eval Right 09/24/22  Shoulder flexion  3+/5  Shoulder abduction  3/5  Shoulder internal rotation  4-/5  Shoulder external rotation  3/5  (Blank rows = not tested)  OBSERVATIONS: Severe fasical restrictions along the biceps, trapezius, and axillary region.    TODAY'S TREATMENT:                                                                                                                              DATE:   10/15/22 -Manual Therapy: myofascial release and trigger  point applied to the biceps, trapezius, and scapular region in order to reduce pain and fascial restrictions to improve ROM.  -Shoulder Strengthening: supine-2lbs, flexion, protraction, er/IR; 1# for horizontal abduction and abduction, 10 reps -A/ROM: seated-protraction, flexion, horizontal abduction, abduction, 10 reps; 1# weight with er/IR -Theraband strengthening: red-protraction, flexion, er, 10 reps -Functional reaching: Pt placing 10 cones on middle shelf of overhead cabinet, then removing, in flexion  10/13/22 -Manual Therapy: myofascial release and trigger point applied to the biceps, trapezius, and scapular region in order to reduce pain and fascial restrictions to improve ROM.  -A/ROM: supine: flexion, abduction, protraction, horizontal abduction, er/IR, x15 -Shoulder Strengthening: 2lbs seated, flexion, abduction, protraction, horizontal abduction, er/IR, x10 -Proximal shoulder strengthening: supine-paddles, criss cross, circles each direction, 1#, x60" -Ball on the Wall: ABC's  10/06/22 -Manual Therapy: myofascial release and trigger point applied to the biceps, trapezius, and scapular region in order to reduce pain and fascial restrictions to improve ROM.  -Shoulder strengthening: supine-protraction, flexion, er/IR, abduction, horizontal abduction, 1#, 12 reps -Proximal shoulder strengthening: supine-paddles, criss cross, circles each direction, 1#, 10 reps each -Ball on wall: 1' flexion, green ball -Overhead lacing: seated, lacing from top down then reversing -Shoulder strengthening: red theraband-protraction, 5 reps -UBE: level 1, 2' forward, 2' reverse, pace: 5.0   PATIENT EDUCATION: Education details: Person educated: Patient Education method: Explanation, Demonstration, and Handouts Education comprehension: verbalized understanding and returned demonstration  HOME EXERCISE PROGRAM: 3/7: Elbow and Wrist ROM, Pendulums 3/11: Table Slides 3/20: AA/ROM 4/1:  Isometrics 4/3: Wall Slides 4/10: A/ROM 4/22: Scapular strengthening 4/25: Stretching (biceps, er, doorway, flexion) 5/21: Shoulder Strengthening (dumbbells)   GOALS: Goals reviewed with patient? Yes  SHORT TERM GOALS: Target date: 08/28/22  Pt will be provided with and educated on HEP to improve mobility in RUE required for use during ADL completion.  Goal status: IN PROGRESS  2.  Pt will increase RUE P/ROM by 40 degrees or greater to improve ability to use RUE during dressing tasks with minimal compensatory techniques.  Goal status: IN PROGRESS  3.  Pt will increase RUE strength to 3+/5 to improve ability to reach for items at waist to chest height during bathing and grooming tasks.  Goal status: IN PROGRESS  LONG TERM GOALS: Target date: 09/25/22  Pt will decrease pain in RUE to 3/10 or less to improve  ability to sleep for 2+ consecutive hours without waking due to pain.  Goal status: IN PROGRESS  2.  Pt will decrease RUE fascial restrictions to min amounts or less to improve mobility required for functional reaching tasks. Goal status: IN PROGRESS  3.   Pt will increase RUE A/ROM to at least 145 for flexion/abduction, and 50 degrees er/IR to improve ability to use RUE when reaching overhead or behind back during dressing and bathing tasks.  Goal status: IN PROGRESS  4.  Pt will increase RUE strength to 4+/5 or greater to improve ability to use LUE when lifting or carrying items during meal preparation/housework/yardwork tasks. Goal status: IN PROGRESS  5.  Pt will return to highest level of function using RUE as dominant during functional task completion.  Goal status: IN PROGRESS  ASSESSMENT:  CLINICAL IMPRESSION: Pt reports minimal pain, has been doing her HEP. Continued progressing towards strengthening today, pt is able to use 2# for some tasks in supine, reduced to 1# for other tasks. Pt with continued trapezius hiking and limited ROM above 50% in standing. Pt  completing functional reaching task, was able to reach middle self with mod to max effort. Rest breaks provided as needed, verbal cuing for form and technique throughout session.   PERFORMANCE DEFICITS: in functional skills including ADLs, IADLs, edema, ROM, strength, pain, fascial restrictions, Gross motor control, body mechanics, and UE functional use.   PLAN:  OT FREQUENCY: 2x/week  OT DURATION: 8 weeks  PLANNED INTERVENTIONS: self care/ADL training, therapeutic exercise, therapeutic activity, manual therapy, scar mobilization, passive range of motion, functional mobility training, electrical stimulation, ultrasound, moist heat, cryotherapy, patient/family education, coping strategies training, and DME and/or AE instructions  CONSULTED AND AGREED WITH PLAN OF CARE: Patient  PLAN FOR NEXT SESSION: manual Therapy, continue with strengthening in supine, AA/ROM in standing, proximal shoulder strengthening   Ezra Sites, OTR/L  [phone] 10/15/2022, 12:03 PM

## 2022-10-21 ENCOUNTER — Ambulatory Visit (HOSPITAL_COMMUNITY): Payer: Medicare HMO | Admitting: Occupational Therapy

## 2022-10-21 ENCOUNTER — Encounter (HOSPITAL_COMMUNITY): Payer: Self-pay | Admitting: Occupational Therapy

## 2022-10-21 DIAGNOSIS — M25611 Stiffness of right shoulder, not elsewhere classified: Secondary | ICD-10-CM

## 2022-10-21 DIAGNOSIS — R29898 Other symptoms and signs involving the musculoskeletal system: Secondary | ICD-10-CM

## 2022-10-21 DIAGNOSIS — M25511 Pain in right shoulder: Secondary | ICD-10-CM | POA: Diagnosis not present

## 2022-10-21 NOTE — Therapy (Signed)
OUTPATIENT OCCUPATIONAL THERAPY ORTHO TREATMENT NOTE  Patient Name: April Murillo MRN: [id number] DOB:[birth year], 72 y.o., female Today's Date: 10/21/2022  PCP: Lia Hopping, MD REFERRING PROVIDER: Thane Edu, MD   END OF SESSION:  OT End of Session - 10/21/22 1226     Visit Number 21    Number of Visits 25    Date for OT Re-Evaluation 11/05/22    Authorization Type [employer]    Progress Note Due on Visit 10    OT Start Time 1031    OT Stop Time 1114    OT Time Calculation (min) 43 min    Activity Tolerance Patient tolerated treatment well    Behavior During Therapy WFL for tasks assessed/performed             Past Medical History:  Diagnosis Date   Arthritis    Diabetes mellitus without complication (HCC)    Hypercholesteremia    Hypertension    Past Surgical History:  Procedure Laterality Date   CATARACT EXTRACTION W/PHACO Right 09/18/2019   Procedure: CATARACT EXTRACTION PHACO AND INTRAOCULAR LENS PLACEMENT RIGHT EYE;  Surgeon: Fabio Pierce, MD;  Location: [facility];  Service: Ophthalmology;  Laterality: Right;  CDE: 6.15   CATARACT EXTRACTION W/PHACO Left 10/02/2019   Procedure: CATARACT EXTRACTION PHACO AND INTRAOCULAR LENS PLACEMENT (IOC) CDE:;  Surgeon: Fabio Pierce, MD;  Location: [facility];  Service: Ophthalmology;  Laterality: Left;   CLOSED REDUCTION / MANIPULATION JOINT     COLONOSCOPY     REVERSE SHOULDER ARTHROPLASTY Right 06/25/2022   Procedure: REVERSE SHOULDER ARTHROPLASTY;  Surgeon: Oliver Barre, MD;  Location: [facility];  Service: Orthopedics;  Laterality: Right;   Patient Active Problem List   Diagnosis Date Noted   Closed 4-part fracture of proximal humerus 06/25/2022   Bilateral primary osteoarthritis of knee 02/02/2019   Body mass index 36.0-36.9, adult 02/02/2019   Blood in stool, frank 12/19/2018    ONSET DATE: 07/13/22  REFERRING DIAG: R Reverse Total Shoulder Surgery  THERAPY DIAG:  Acute pain of right shoulder  Shoulder  stiffness, right  Other symptoms and signs involving the musculoskeletal system  Rationale for Evaluation and Treatment: Rehabilitation  SUBJECTIVE:   SUBJECTIVE STATEMENT: S: "I'm tired of strengthening everything"   PERTINENT HISTORY: Pt fell on 06/10/22 fracturing her humerus and injuring her shoulder. Overall pt has been in an immobilizer ever since.   PRECAUTIONS: Shoulder-see protocol  WEIGHT BEARING RESTRICTIONS: Yes No lifting  PAIN:  Are you having pain? No  FALLS: Has patient fallen in last 6 months? Yes. Number of falls 1  PATIENT GOALS: To get use of her arm back  NEXT MD VISIT: 6 weeks  OBJECTIVE:   HAND DOMINANCE: Right  ADLs: Overall ADLs: Pt requiring assist with all ADL's due to being unable to move her arm, she is requring total assist for cleaning and most cooking tasks.   FUNCTIONAL OUTCOME MEASURES: FOTO: 29.81 08/31/22: 52.09 10/06/22: 51/100  UPPER EXTREMITY ROM:       Assessed supine, er/IR adducted  Passive ROM In Supine Right eval Right 08/31/22  Shoulder flexion 113 129  Shoulder abduction 104 139  Shoulder internal rotation 90 90  Shoulder external rotation 49 45  Elbow flexion 140   Elbow extension -22   (Blank rows = not tested)  Active ROM In Seated Right 09/24/22  Shoulder flexion 68  Shoulder abduction 59  Shoulder internal rotation 90  Shoulder external rotation 36  Elbow flexion 137  Elbow extension 0  (  Blank rows = not tested)  UPPER EXTREMITY MMT:     MMT Right eval Right 09/24/22  Shoulder flexion  3+/5  Shoulder abduction  3/5  Shoulder internal rotation  4-/5  Shoulder external rotation  3/5  (Blank rows = not tested)  OBSERVATIONS: Severe fasical restrictions along the biceps, trapezius, and axillary region.    TODAY'S TREATMENT:                                                                                                                              DATE:   10/21/22 -Manual Therapy: myofascial release  and trigger point applied to the biceps, trapezius, and scapular region in order to reduce pain and fascial restrictions to improve ROM.  -Shoulder Strengthening: supine-2lbs, flexion, protraction, er/IR, abduction; 1# for horizontal abduction, x12 -A/ROM: seated, flexion, abduction, protraction, horizontal abduction, er/IR, x12 -AA/ROM: seated, 5lb dowel, flexion, abduction, protraction, horizontal abduction, er/IR, x10 -Proximal shoulder strengthening: paddles, criss cross, circles both directions, x10  10/15/22 -Manual Therapy: myofascial release and trigger point applied to the biceps, trapezius, and scapular region in order to reduce pain and fascial restrictions to improve ROM.  -Shoulder Strengthening: supine-2lbs, flexion, protraction, er/IR; 1# for horizontal abduction and abduction, 10 reps -A/ROM: seated-protraction, flexion, horizontal abduction, abduction, 10 reps; 1# weight with er/IR -Theraband strengthening: red-protraction, flexion, er, 10 reps -Functional reaching: Pt placing 10 cones on middle shelf of overhead cabinet, then removing, in flexion  10/13/22 -Manual Therapy: myofascial release and trigger point applied to the biceps, trapezius, and scapular region in order to reduce pain and fascial restrictions to improve ROM.  -A/ROM: supine: flexion, abduction, protraction, horizontal abduction, er/IR, x15 -Shoulder Strengthening: 2lbs seated, flexion, abduction, protraction, horizontal abduction, er/IR, x10 -Proximal shoulder strengthening: supine-paddles, criss cross, circles each direction, 1#, x60" -Ball on the Wall: ABC's   PATIENT EDUCATION: Education details: Continue HEP Person educated: Patient Education method: Programmer, multimedia, Demonstration, and Handouts Education comprehension: verbalized understanding and returned demonstration  HOME EXERCISE PROGRAM: 3/7: Elbow and Wrist ROM, Pendulums 3/11: Table Slides 3/20: AA/ROM 4/1: Isometrics 4/3: Wall Slides 4/10:  A/ROM 4/22: Scapular strengthening 4/25: Stretching (biceps, er, doorway, flexion) 5/21: Shoulder Strengthening (dumbbells)   GOALS: Goals reviewed with patient? Yes  SHORT TERM GOALS: Target date: 08/28/22  Pt will be provided with and educated on HEP to improve mobility in RUE required for use during ADL completion.  Goal status: IN PROGRESS  2.  Pt will increase RUE P/ROM by 40 degrees or greater to improve ability to use RUE during dressing tasks with minimal compensatory techniques.  Goal status: IN PROGRESS  3.  Pt will increase RUE strength to 3+/5 to improve ability to reach for items at waist to chest height during bathing and grooming tasks.  Goal status: IN PROGRESS  LONG TERM GOALS: Target date: 09/25/22  Pt will decrease pain in RUE to 3/10 or less to improve ability to sleep for 2+ consecutive hours without waking due to  pain.  Goal status: IN PROGRESS  2.  Pt will decrease RUE fascial restrictions to min amounts or less to improve mobility required for functional reaching tasks. Goal status: IN PROGRESS  3.   Pt will increase RUE A/ROM to at least 145 for flexion/abduction, and 50 degrees er/IR to improve ability to use RUE when reaching overhead or behind back during dressing and bathing tasks.  Goal status: IN PROGRESS  4.  Pt will increase RUE strength to 4+/5 or greater to improve ability to use LUE when lifting or carrying items during meal preparation/housework/yardwork tasks. Goal status: IN PROGRESS  5.  Pt will return to highest level of function using RUE as dominant during functional task completion.  Goal status: IN PROGRESS  ASSESSMENT:  CLINICAL IMPRESSION: This session pt continuing to make incremental progress with strengthening, she was able to tolerate 2# dumbbells for all motions except for horizontal abduction. She continues to reach approximately 50% of full ROM in sitting/standing with no weights. Additionally this session she had increased  muscle fatigue when using the 5lb dowel rod for AA/ROM, requiring a rest break during each motion. OT providing verbal and visual cuing to reduce shoulder hiking and promote improved positioning and technique.   PERFORMANCE DEFICITS: in functional skills including ADLs, IADLs, edema, ROM, strength, pain, fascial restrictions, Gross motor control, body mechanics, and UE functional use.   PLAN:  OT FREQUENCY: 2x/week  OT DURATION: 8 weeks  PLANNED INTERVENTIONS: self care/ADL training, therapeutic exercise, therapeutic activity, manual therapy, scar mobilization, passive range of motion, functional mobility training, electrical stimulation, ultrasound, moist heat, cryotherapy, patient/family education, coping strategies training, and DME and/or AE instructions  CONSULTED AND AGREED WITH PLAN OF CARE: Patient  PLAN FOR NEXT SESSION: manual Therapy, continue with strengthening in supine, AA/ROM in standing, proximal shoulder strengthening   Trish Mage, OTR/L [phone] 10/21/2022, 12:27 PM

## 2022-10-23 ENCOUNTER — Encounter (HOSPITAL_COMMUNITY): Payer: Self-pay | Admitting: Occupational Therapy

## 2022-10-23 ENCOUNTER — Ambulatory Visit (HOSPITAL_COMMUNITY): Payer: Medicare HMO | Admitting: Occupational Therapy

## 2022-10-23 DIAGNOSIS — R29898 Other symptoms and signs involving the musculoskeletal system: Secondary | ICD-10-CM | POA: Diagnosis not present

## 2022-10-23 DIAGNOSIS — M25611 Stiffness of right shoulder, not elsewhere classified: Secondary | ICD-10-CM | POA: Diagnosis not present

## 2022-10-23 DIAGNOSIS — M25511 Pain in right shoulder: Secondary | ICD-10-CM

## 2022-10-23 NOTE — Therapy (Signed)
OUTPATIENT OCCUPATIONAL THERAPY ORTHO TREATMENT NOTE  Patient Name: April Murillo MRN: [id number] DOB:May 31, 1950, 72 y.o., female Today's Date: 10/23/2022  PCP: Lia Hopping, MD REFERRING PROVIDER: Thane Edu, MD   END OF SESSION:  OT End of Session - 10/23/22 1452     Visit Number 22    Number of Visits 25    Date for OT Re-Evaluation 11/05/22    Authorization Type [employer]    Progress Note Due on Visit 10    OT Start Time 0912    OT Stop Time 0955    OT Time Calculation (min) 43 min    Activity Tolerance Patient tolerated treatment well    Behavior During Therapy [facility] for tasks assessed/performed             Past Medical History:  Diagnosis Date   Arthritis    Diabetes mellitus without complication (HCC)    Hypercholesteremia    Hypertension    Past Surgical History:  Procedure Laterality Date   CATARACT EXTRACTION W/PHACO Right 09/18/2019   Procedure: CATARACT EXTRACTION PHACO AND INTRAOCULAR LENS PLACEMENT RIGHT EYE;  Surgeon: Fabio Pierce, MD;  Location: [facility];  Service: Ophthalmology;  Laterality: Right;  CDE: 6.15   CATARACT EXTRACTION W/PHACO Left 10/02/2019   Procedure: CATARACT EXTRACTION PHACO AND INTRAOCULAR LENS PLACEMENT (IOC) CDE:;  Surgeon: Fabio Pierce, MD;  Location: [facility];  Service: Ophthalmology;  Laterality: Left;   CLOSED REDUCTION / MANIPULATION JOINT     COLONOSCOPY     REVERSE SHOULDER ARTHROPLASTY Right 06/25/2022   Procedure: REVERSE SHOULDER ARTHROPLASTY;  Surgeon: Oliver Barre, MD;  Location: [facility];  Service: Orthopedics;  Laterality: Right;   Patient Active Problem List   Diagnosis Date Noted   Closed 4-part fracture of proximal humerus 06/25/2022   Bilateral primary osteoarthritis of knee 02/02/2019   Body mass index 36.0-36.9, adult 02/02/2019   Blood in stool, frank 12/19/2018    ONSET DATE: 07/13/22  REFERRING DIAG: R Reverse Total Shoulder Surgery  THERAPY DIAG:  Acute pain of right shoulder  Shoulder  stiffness, right  Other symptoms and signs involving the musculoskeletal system  Rationale for Evaluation and Treatment: Rehabilitation  SUBJECTIVE:   SUBJECTIVE STATEMENT: S: "I want to get my arm over my head."   PERTINENT HISTORY: Pt fell on 06/10/22 fracturing her humerus and injuring her shoulder. Overall pt has been in an immobilizer ever since.   PRECAUTIONS: Shoulder-see protocol  WEIGHT BEARING RESTRICTIONS: Yes No lifting  PAIN:  Are you having pain? No  FALLS: Has patient fallen in last 6 months? Yes. Number of falls 1  PATIENT GOALS: To get use of her arm back  NEXT MD VISIT: 6 weeks  OBJECTIVE:   HAND DOMINANCE: Right  ADLs: Overall ADLs: Pt requiring assist with all ADL's due to being unable to move her arm, she is requring total assist for cleaning and most cooking tasks.   FUNCTIONAL OUTCOME MEASURES: FOTO: 29.81 08/31/22: 52.09 10/06/22: 51/100  UPPER EXTREMITY ROM:       Assessed supine, er/IR adducted  Passive ROM In Supine Right eval Right 08/31/22  Shoulder flexion 113 129  Shoulder abduction 104 139  Shoulder internal rotation 90 90  Shoulder external rotation 49 45  Elbow flexion 140   Elbow extension -22   (Blank rows = not tested)  Active ROM In Seated Right 09/24/22  Shoulder flexion 68  Shoulder abduction 59  Shoulder internal rotation 90  Shoulder external rotation 36  Elbow flexion 137  Elbow extension 0  (Blank rows = not tested)  UPPER EXTREMITY MMT:     MMT Right eval Right 09/24/22  Shoulder flexion  3+/5  Shoulder abduction  3/5  Shoulder internal rotation  4-/5  Shoulder external rotation  3/5  (Blank rows = not tested)  OBSERVATIONS: Severe fasical restrictions along the biceps, trapezius, and axillary region.    TODAY'S TREATMENT:                                                                                                                              DATE:   10/23/22 -Manual Therapy: myofascial release and  trigger point applied to the biceps, trapezius, and scapular region in order to reduce pain and fascial restrictions to improve ROM.  -Shoulder Strengthening: side lying-2lbs, flexion, protraction, er/IR, abduction, horizontal abduction, x12 -A/ROM: supine, flexion, abduction, protraction, horizontal abduction, er/IR, x12 -Wall Slides: flexion, abduction, x10 -Latissimus stretch, 4x20" -A/ROM: seated, flexion, abduction, protraction, horizontal abduction, er/IR, x12  10/21/22 -Manual Therapy: myofascial release and trigger point applied to the biceps, trapezius, and scapular region in order to reduce pain and fascial restrictions to improve ROM.  -Shoulder Strengthening: supine-2lbs, flexion, protraction, er/IR, abduction; 1# for horizontal abduction, x12 -A/ROM: seated, flexion, abduction, protraction, horizontal abduction, er/IR, x12 -AA/ROM: seated, 5lb dowel, flexion, abduction, protraction, horizontal abduction, er/IR, x10 -Proximal shoulder strengthening: paddles, criss cross, circles both directions, x10  10/15/22 -Manual Therapy: myofascial release and trigger point applied to the biceps, trapezius, and scapular region in order to reduce pain and fascial restrictions to improve ROM.  -Shoulder Strengthening: supine-2lbs, flexion, protraction, er/IR; 1# for horizontal abduction and abduction, 10 reps -A/ROM: seated-protraction, flexion, horizontal abduction, abduction, 10 reps; 1# weight with er/IR -Theraband strengthening: red-protraction, flexion, er, 10 reps -Functional reaching: Pt placing 10 cones on middle shelf of overhead cabinet, then removing, in flexion   PATIENT EDUCATION: Education details: Continue HEP Person educated: Patient Education method: Programmer, multimedia, Demonstration, and Handouts Education comprehension: verbalized understanding and returned demonstration  HOME EXERCISE PROGRAM: 3/7: Elbow and Wrist ROM, Pendulums 3/11: Table Slides 3/20: AA/ROM 4/1:  Isometrics 4/3: Wall Slides 4/10: A/ROM 4/22: Scapular strengthening 4/25: Stretching (biceps, er, doorway, flexion) 5/21: Shoulder Strengthening (dumbbells)   GOALS: Goals reviewed with patient? Yes  SHORT TERM GOALS: Target date: 08/28/22  Pt will be provided with and educated on HEP to improve mobility in RUE required for use during ADL completion.  Goal status: IN PROGRESS  2.  Pt will increase RUE P/ROM by 40 degrees or greater to improve ability to use RUE during dressing tasks with minimal compensatory techniques.  Goal status: IN PROGRESS  3.  Pt will increase RUE strength to 3+/5 to improve ability to reach for items at waist to chest height during bathing and grooming tasks.  Goal status: IN PROGRESS  LONG TERM GOALS: Target date: 09/25/22  Pt will decrease pain in RUE to 3/10 or less to improve ability to sleep for 2+ consecutive  hours without waking due to pain.  Goal status: IN PROGRESS  2.  Pt will decrease RUE fascial restrictions to min amounts or less to improve mobility required for functional reaching tasks. Goal status: IN PROGRESS  3.   Pt will increase RUE A/ROM to at least 145 for flexion/abduction, and 50 degrees er/IR to improve ability to use RUE when reaching overhead or behind back during dressing and bathing tasks.  Goal status: IN PROGRESS  4.  Pt will increase RUE strength to 4+/5 or greater to improve ability to use LUE when lifting or carrying items during meal preparation/housework/yardwork tasks. Goal status: IN PROGRESS  5.  Pt will return to highest level of function using RUE as dominant during functional task completion.  Goal status: IN PROGRESS  ASSESSMENT:  CLINICAL IMPRESSION: In side lying this session, pt was able to complete 2# dumbbells for ROM, with 85% of full ROM. Without weights in supine she was able to complete 75% of full ROM and in sitting she continues to reach around 50% of full ROM. Throughout session she reports minimal  to no pain, only that she feels weak and can't make certain movements happen. OT Providing verbal and tactile cuing for positioning and technique with all exercises.   PERFORMANCE DEFICITS: in functional skills including ADLs, IADLs, edema, ROM, strength, pain, fascial restrictions, Gross motor control, body mechanics, and UE functional use.   PLAN:  OT FREQUENCY: 2x/week  OT DURATION: 8 weeks  PLANNED INTERVENTIONS: self care/ADL training, therapeutic exercise, therapeutic activity, manual therapy, scar mobilization, passive range of motion, functional mobility training, electrical stimulation, ultrasound, moist heat, cryotherapy, patient/family education, coping strategies training, and DME and/or AE instructions  CONSULTED AND AGREED WITH PLAN OF CARE: Patient  PLAN FOR NEXT SESSION: manual Therapy, continue with strengthening in supine, AA/ROM in standing, proximal shoulder strengthening   Trish Mage, OTR/L [phone] 10/23/2022, 2:53 PM

## 2022-10-26 ENCOUNTER — Ambulatory Visit (HOSPITAL_COMMUNITY): Payer: Medicare HMO | Attending: Orthopedic Surgery | Admitting: Occupational Therapy

## 2022-10-26 DIAGNOSIS — R29898 Other symptoms and signs involving the musculoskeletal system: Secondary | ICD-10-CM | POA: Insufficient documentation

## 2022-10-26 DIAGNOSIS — M25511 Pain in right shoulder: Secondary | ICD-10-CM | POA: Diagnosis not present

## 2022-10-26 DIAGNOSIS — M25611 Stiffness of right shoulder, not elsewhere classified: Secondary | ICD-10-CM | POA: Insufficient documentation

## 2022-10-26 NOTE — Therapy (Signed)
OUTPATIENT OCCUPATIONAL THERAPY ORTHO TREATMENT NOTE  Patient Name: April Murillo MRN: [id number] DOB:21-Jun-1950, 72 y.o., female Today's Date: 10/26/2022  PCP: Lia Hopping, MD REFERRING PROVIDER: Thane Edu, MD   END OF SESSION:  OT End of Session - 10/26/22 1237     Visit Number 23    Number of Visits 25    Date for OT Re-Evaluation 11/05/22    Authorization Type [employer]    Progress Note Due on Visit 10    OT Start Time 1118    OT Stop Time 1200    OT Time Calculation (min) 42 min    Activity Tolerance Patient tolerated treatment well    Behavior During Therapy WFL for tasks assessed/performed             Past Medical History:  Diagnosis Date   Arthritis    Diabetes mellitus without complication (HCC)    Hypercholesteremia    Hypertension    Past Surgical History:  Procedure Laterality Date   CATARACT EXTRACTION W/PHACO Right 09/18/2019   Procedure: CATARACT EXTRACTION PHACO AND INTRAOCULAR LENS PLACEMENT RIGHT EYE;  Surgeon: Fabio Pierce, MD;  Location: [facility];  Service: Ophthalmology;  Laterality: Right;  CDE: 6.15   CATARACT EXTRACTION W/PHACO Left 10/02/2019   Procedure: CATARACT EXTRACTION PHACO AND INTRAOCULAR LENS PLACEMENT (IOC) CDE:;  Surgeon: Fabio Pierce, MD;  Location: [facility];  Service: Ophthalmology;  Laterality: Left;   CLOSED REDUCTION / MANIPULATION JOINT     COLONOSCOPY     REVERSE SHOULDER ARTHROPLASTY Right 06/25/2022   Procedure: REVERSE SHOULDER ARTHROPLASTY;  Surgeon: Oliver Barre, MD;  Location: [facility];  Service: Orthopedics;  Laterality: Right;   Patient Active Problem List   Diagnosis Date Noted   Closed 4-part fracture of proximal humerus 06/25/2022   Bilateral primary osteoarthritis of knee 02/02/2019   Body mass index 36.0-36.9, adult 02/02/2019   Blood in stool, frank 12/19/2018    ONSET DATE: 07/13/22  REFERRING DIAG: R Reverse Total Shoulder Surgery  THERAPY DIAG:  Acute pain of right shoulder  Shoulder  stiffness, right  Other symptoms and signs involving the musculoskeletal system  Rationale for Evaluation and Treatment: Rehabilitation  SUBJECTIVE:   SUBJECTIVE STATEMENT: S: "I was able to lift my hand over my head today!"   PERTINENT HISTORY: Pt fell on 06/10/22 fracturing her humerus and injuring her shoulder. Overall pt has been in an immobilizer ever since.   PRECAUTIONS: Shoulder-see protocol  WEIGHT BEARING RESTRICTIONS: No  PAIN:  Are you having pain? No  FALLS: Has patient fallen in last 6 months? Yes. Number of falls 1  PATIENT GOALS: To get use of her arm back  NEXT MD VISIT: 6 weeks  OBJECTIVE:   HAND DOMINANCE: Right  ADLs: Overall ADLs: Pt requiring assist with all ADL's due to being unable to move her arm, she is requring total assist for cleaning and most cooking tasks.   FUNCTIONAL OUTCOME MEASURES: FOTO: 29.81 08/31/22: 52.09 10/06/22: 51/100  UPPER EXTREMITY ROM:       Assessed supine, er/IR adducted  Passive ROM In Supine Right eval Right 08/31/22  Shoulder flexion 113 129  Shoulder abduction 104 139  Shoulder internal rotation 90 90  Shoulder external rotation 49 45  Elbow flexion 140   Elbow extension -22   (Blank rows = not tested)  Active ROM In Seated Right 09/24/22  Shoulder flexion 68  Shoulder abduction 59  Shoulder internal rotation 90  Shoulder external rotation 36  Elbow flexion 137  Elbow extension 0  (Blank rows = not tested)  UPPER EXTREMITY MMT:     MMT Right eval Right 09/24/22  Shoulder flexion  3+/5  Shoulder abduction  3/5  Shoulder internal rotation  4-/5  Shoulder external rotation  3/5  (Blank rows = not tested)  OBSERVATIONS: Severe fasical restrictions along the biceps, trapezius, and axillary region.    TODAY'S TREATMENT:                                                                                                                              DATE:   10/26/22 -Manual Therapy: myofascial release and  trigger point applied to the biceps, trapezius, and scapular region in order to reduce pain and fascial restrictions to improve ROM.  -A/ROM: seated, flexion, abduction, protraction, horizontal abduction, er/IR, x10 -Shoulder Strengthening: side lying-2lbs, flexion, protraction, er/IR, abduction, horizontal abduction, x12 -AA/ROM: seated, 5lb dowel, flexion, protraction, er/IR, abduction, horizontal abduction, x15 -Proximal shoulder exercises: paddles, criss cross, circles both directions, x12 each  10/23/22 -Manual Therapy: myofascial release and trigger point applied to the biceps, trapezius, and scapular region in order to reduce pain and fascial restrictions to improve ROM.  -Shoulder Strengthening: side lying-2lbs, flexion, protraction, er/IR, abduction, horizontal abduction, x12 -A/ROM: supine, flexion, abduction, protraction, horizontal abduction, er/IR, x12 -Wall Slides: flexion, abduction, x10 -Latissimus stretch, 4x20" -A/ROM: seated, flexion, abduction, protraction, horizontal abduction, er/IR, x12  10/21/22 -Manual Therapy: myofascial release and trigger point applied to the biceps, trapezius, and scapular region in order to reduce pain and fascial restrictions to improve ROM.  -Shoulder Strengthening: supine-2lbs, flexion, protraction, er/IR, abduction; 1# for horizontal abduction, x12 -A/ROM: seated, flexion, abduction, protraction, horizontal abduction, er/IR, x12 -AA/ROM: seated, 5lb dowel, flexion, abduction, protraction, horizontal abduction, er/IR, x10 -Proximal shoulder strengthening: paddles, criss cross, circles both directions, x10   PATIENT EDUCATION: Education details: Continue HEP Person educated: Patient Education method: Programmer, multimedia, Demonstration, and Handouts Education comprehension: verbalized understanding and returned demonstration  HOME EXERCISE PROGRAM: 3/7: Elbow and Wrist ROM, Pendulums 3/11: Table Slides 3/20: AA/ROM 4/1: Isometrics 4/3: Wall  Slides 4/10: A/ROM 4/22: Scapular strengthening 4/25: Stretching (biceps, er, doorway, flexion) 5/21: Shoulder Strengthening (dumbbells)   GOALS: Goals reviewed with patient? Yes  SHORT TERM GOALS: Target date: 08/28/22  Pt will be provided with and educated on HEP to improve mobility in RUE required for use during ADL completion.  Goal status: IN PROGRESS  2.  Pt will increase RUE P/ROM by 40 degrees or greater to improve ability to use RUE during dressing tasks with minimal compensatory techniques.  Goal status: IN PROGRESS  3.  Pt will increase RUE strength to 3+/5 to improve ability to reach for items at waist to chest height during bathing and grooming tasks.  Goal status: IN PROGRESS  LONG TERM GOALS: Target date: 09/25/22  Pt will decrease pain in RUE to 3/10 or less to improve ability to sleep for 2+ consecutive hours without waking due to pain.  Goal status: IN PROGRESS  2.  Pt will decrease RUE fascial restrictions to min amounts or less to improve mobility required for functional reaching tasks. Goal status: IN PROGRESS  3.   Pt will increase RUE A/ROM to at least 145 for flexion/abduction, and 50 degrees er/IR to improve ability to use RUE when reaching overhead or behind back during dressing and bathing tasks.  Goal status: IN PROGRESS  4.  Pt will increase RUE strength to 4+/5 or greater to improve ability to use LUE when lifting or carrying items during meal preparation/housework/yardwork tasks. Goal status: IN PROGRESS  5.  Pt will return to highest level of function using RUE as dominant during functional task completion.  Goal status: IN PROGRESS  ASSESSMENT:  CLINICAL IMPRESSION: This session after manual therapy was completed on the RUE, pt was able to sit up and complete A/ROM reaching approximately 65% of full ROM for the first time actively in sitting. Remainder of session continuing to address overall strengthening, geared towards improving ROM. OT  providing verbal and visual cuing for positioning and technique throughout session.   PERFORMANCE DEFICITS: in functional skills including ADLs, IADLs, edema, ROM, strength, pain, fascial restrictions, Gross motor control, body mechanics, and UE functional use.   PLAN:  OT FREQUENCY: 2x/week  OT DURATION: 8 weeks  PLANNED INTERVENTIONS: self care/ADL training, therapeutic exercise, therapeutic activity, manual therapy, scar mobilization, passive range of motion, functional mobility training, electrical stimulation, ultrasound, moist heat, cryotherapy, patient/family education, coping strategies training, and DME and/or AE instructions  CONSULTED AND AGREED WITH PLAN OF CARE: Patient  PLAN FOR NEXT SESSION: manual Therapy, continue with strengthening in supine, AA/ROM in standing, proximal shoulder strengthening   Trish Mage, OTR/L [phone] 10/26/2022, 12:38 PM

## 2022-10-28 ENCOUNTER — Ambulatory Visit (HOSPITAL_COMMUNITY): Payer: Medicare HMO | Admitting: Occupational Therapy

## 2022-10-28 DIAGNOSIS — M25511 Pain in right shoulder: Secondary | ICD-10-CM

## 2022-10-28 DIAGNOSIS — M25611 Stiffness of right shoulder, not elsewhere classified: Secondary | ICD-10-CM

## 2022-10-28 DIAGNOSIS — R29898 Other symptoms and signs involving the musculoskeletal system: Secondary | ICD-10-CM | POA: Diagnosis not present

## 2022-10-28 NOTE — Therapy (Signed)
OUTPATIENT OCCUPATIONAL THERAPY ORTHO TREATMENT NOTE  Patient Name: April Murillo MRN: [id number] DOB:12/30/1950, 72 y.o., female Today's Date: 10/28/2022  PCP: Lia Hopping, MD REFERRING PROVIDER: Thane Edu, MD   END OF SESSION:  OT End of Session - 10/28/22 1255     Visit Number 24    Number of Visits 30    Date for OT Re-Evaluation 12/18/22    Authorization Type [employer]    Progress Note Due on Visit 30    OT Start Time 1031    OT Stop Time 1115    OT Time Calculation (min) 44 min    Activity Tolerance Patient tolerated treatment well    Behavior During Therapy WFL for tasks assessed/performed             Past Medical History:  Diagnosis Date   Arthritis    Diabetes mellitus without complication (HCC)    Hypercholesteremia    Hypertension    Past Surgical History:  Procedure Laterality Date   CATARACT EXTRACTION W/PHACO Right 09/18/2019   Procedure: CATARACT EXTRACTION PHACO AND INTRAOCULAR LENS PLACEMENT RIGHT EYE;  Surgeon: Fabio Pierce, MD;  Location: [facility];  Service: Ophthalmology;  Laterality: Right;  CDE: 6.15   CATARACT EXTRACTION W/PHACO Left 10/02/2019   Procedure: CATARACT EXTRACTION PHACO AND INTRAOCULAR LENS PLACEMENT (IOC) CDE:;  Surgeon: Fabio Pierce, MD;  Location: [facility];  Service: Ophthalmology;  Laterality: Left;   CLOSED REDUCTION / MANIPULATION JOINT     COLONOSCOPY     REVERSE SHOULDER ARTHROPLASTY Right 06/25/2022   Procedure: REVERSE SHOULDER ARTHROPLASTY;  Surgeon: Oliver Barre, MD;  Location: [facility];  Service: Orthopedics;  Laterality: Right;   Patient Active Problem List   Diagnosis Date Noted   Closed 4-part fracture of proximal humerus 06/25/2022   Bilateral primary osteoarthritis of knee 02/02/2019   Body mass index 36.0-36.9, adult 02/02/2019   Blood in stool, frank 12/19/2018    ONSET DATE: 07/13/22  REFERRING DIAG: R Reverse Total Shoulder Surgery  THERAPY DIAG:  Acute pain of right shoulder  Shoulder  stiffness, right  Other symptoms and signs involving the musculoskeletal system  Rationale for Evaluation and Treatment: Rehabilitation  SUBJECTIVE:   SUBJECTIVE STATEMENT: S: "I think it's getting better."   PERTINENT HISTORY: Pt fell on 06/10/22 fracturing her humerus and injuring her shoulder. Overall pt has been in an immobilizer ever since.   PRECAUTIONS: Shoulder-see protocol  WEIGHT BEARING RESTRICTIONS: No  PAIN:  Are you having pain? No  FALLS: Has patient fallen in last 6 months? Yes. Number of falls 1  PATIENT GOALS: To get use of her arm back  NEXT MD VISIT: 6 weeks  OBJECTIVE:   HAND DOMINANCE: Right  ADLs: Overall ADLs: Pt requiring assist with all ADL's due to being unable to move her arm, she is requring total assist for cleaning and most cooking tasks.   FUNCTIONAL OUTCOME MEASURES: FOTO: 29.81 08/31/22: 52.09 10/06/22: 51/100 10/28/22: 59.89/100  UPPER EXTREMITY ROM:       Assessed supine, er/IR adducted  Passive ROM In Supine Right eval Right 08/31/22  Shoulder flexion 113 129  Shoulder abduction 104 139  Shoulder internal rotation 90 90  Shoulder external rotation 49 45  Elbow flexion 140   Elbow extension -22   (Blank rows = not tested)  Active ROM In Seated Right 09/24/22 Right 10/28/22  Shoulder flexion 68 110  Shoulder abduction 59 99  Shoulder internal rotation 90 90  Shoulder external rotation 36 52  Elbow flexion  137   Elbow extension 0   (Blank rows = not tested)  UPPER EXTREMITY MMT:     MMT Right eval Right 09/24/22 Right 10/28/22  Shoulder flexion  3+/5 3+/5  Shoulder abduction  3/5 3+/5  Shoulder internal rotation  4-/5 4/5  Shoulder external rotation  3/5 4-/5  (Blank rows = not tested)  OBSERVATIONS: Severe fasical restrictions along the biceps, trapezius, and axillary region.    TODAY'S TREATMENT:                                                                                                                               DATE:   10/28/22 -Manual Therapy: myofascial release and trigger point applied to the biceps, trapezius, and scapular region in order to reduce pain and fascial restrictions to improve ROM.  -A/ROM: seated, flexion, abduction, protraction, horizontal abduction, er/IR, x10 -Shoulder strengthening: Supine, flexion, abduction, protraction, horizontal abduction, er/IR, x10 -Stretching: er, flexion, bicep stretch 4x20"  10/26/22 -Manual Therapy: myofascial release and trigger point applied to the biceps, trapezius, and scapular region in order to reduce pain and fascial restrictions to improve ROM.  -A/ROM: seated, flexion, abduction, protraction, horizontal abduction, er/IR, x10 -Shoulder Strengthening: side lying-2lbs, flexion, protraction, er/IR, abduction, horizontal abduction, x12 -AA/ROM: seated, 5lb dowel, flexion, protraction, er/IR, abduction, horizontal abduction, x15 -Proximal shoulder exercises: paddles, criss cross, circles both directions, x12 each  10/23/22 -Manual Therapy: myofascial release and trigger point applied to the biceps, trapezius, and scapular region in order to reduce pain and fascial restrictions to improve ROM.  -Shoulder Strengthening: side lying-2lbs, flexion, protraction, er/IR, abduction, horizontal abduction, x12 -A/ROM: supine, flexion, abduction, protraction, horizontal abduction, er/IR, x12 -Wall Slides: flexion, abduction, x10 -Latissimus stretch, 4x20" -A/ROM: seated, flexion, abduction, protraction, horizontal abduction, er/IR, x12   PATIENT EDUCATION: Education details: Continue HEP Person educated: Patient Education method: Programmer, multimedia, Demonstration, and Handouts Education comprehension: verbalized understanding and returned demonstration  HOME EXERCISE PROGRAM: 3/7: Elbow and Wrist ROM, Pendulums 3/11: Table Slides 3/20: AA/ROM 4/1: Isometrics 4/3: Wall Slides 4/10: A/ROM 4/22: Scapular strengthening 4/25: Stretching (biceps, er, doorway,  flexion) 5/21: Shoulder Strengthening (dumbbells)   GOALS: Goals reviewed with patient? Yes  SHORT TERM GOALS: Target date: 08/28/22  Pt will be provided with and educated on HEP to improve mobility in RUE required for use during ADL completion.  Goal status: IN PROGRESS  2.  Pt will increase RUE P/ROM by 40 degrees or greater to improve ability to use RUE during dressing tasks with minimal compensatory techniques.  Goal status: IN PROGRESS  3.  Pt will increase RUE strength to 3+/5 to improve ability to reach for items at waist to chest height during bathing and grooming tasks.  Goal status: IN PROGRESS  LONG TERM GOALS: Target date: 09/25/22  Pt will decrease pain in RUE to 3/10 or less to improve ability to sleep for 2+ consecutive hours without waking due to pain.  Goal status: IN PROGRESS  2.  Pt will decrease RUE fascial restrictions to min amounts or less to improve mobility required for functional reaching tasks. Goal status: IN PROGRESS  3.   Pt will increase RUE A/ROM to at least 145 for flexion/abduction, and 50 degrees er/IR to improve ability to use RUE when reaching overhead or behind back during dressing and bathing tasks.  Goal status: IN PROGRESS  4.  Pt will increase RUE strength to 4+/5 or greater to improve ability to use LUE when lifting or carrying items during meal preparation/housework/yardwork tasks. Goal status: IN PROGRESS  5.  Pt will return to highest level of function using RUE as dominant during functional task completion.  Goal status: IN PROGRESS  ASSESSMENT:  CLINICAL IMPRESSION: Pt completed her reassessment this session, where she demonstrated improved ROM to approximately 60% of full ROM, with improved strength in all motions as well. She continues to have minimal to no pain, with moderate fascial restrictions along her biceps, trapezius, and scapular region addressed with manual therapy. OT recommending therapy continue for 1x per week for 6  weeks to focus on strengthening and continuing to improve ROM to closer to functional limits. Verbal and visual cuing provided with positioning and technique with all exercises this session.   PERFORMANCE DEFICITS: in functional skills including ADLs, IADLs, edema, ROM, strength, pain, fascial restrictions, Gross motor control, body mechanics, and UE functional use.   PLAN:  OT FREQUENCY: 1-2x/week  OT DURATION: 6 weeks  PLANNED INTERVENTIONS: self care/ADL training, therapeutic exercise, therapeutic activity, manual therapy, scar mobilization, passive range of motion, functional mobility training, electrical stimulation, ultrasound, moist heat, cryotherapy, patient/family education, coping strategies training, and DME and/or AE instructions  CONSULTED AND AGREED WITH PLAN OF CARE: Patient  PLAN FOR NEXT SESSION: manual Therapy, continue with strengthening in supine, AA/ROM in standing, proximal shoulder strengthening   Trish Mage, OTR/L [phone] 10/28/2022, 1:02 PM

## 2022-10-28 NOTE — Addendum Note (Signed)
Addended by: Haydee Monica on: 10/28/2022 03:11 PM   Modules accepted: Orders

## 2022-11-04 ENCOUNTER — Other Ambulatory Visit: Payer: Self-pay | Admitting: Orthopedic Surgery

## 2022-11-09 DIAGNOSIS — Z6831 Body mass index (BMI) 31.0-31.9, adult: Secondary | ICD-10-CM | POA: Diagnosis not present

## 2022-11-09 DIAGNOSIS — Z0000 Encounter for general adult medical examination without abnormal findings: Secondary | ICD-10-CM | POA: Diagnosis not present

## 2022-11-09 DIAGNOSIS — I10 Essential (primary) hypertension: Secondary | ICD-10-CM | POA: Diagnosis not present

## 2022-11-09 DIAGNOSIS — E1169 Type 2 diabetes mellitus with other specified complication: Secondary | ICD-10-CM | POA: Diagnosis not present

## 2022-11-09 DIAGNOSIS — J3081 Allergic rhinitis due to animal (cat) (dog) hair and dander: Secondary | ICD-10-CM | POA: Diagnosis not present

## 2022-11-09 DIAGNOSIS — K219 Gastro-esophageal reflux disease without esophagitis: Secondary | ICD-10-CM | POA: Diagnosis not present

## 2022-11-09 DIAGNOSIS — E785 Hyperlipidemia, unspecified: Secondary | ICD-10-CM | POA: Diagnosis not present

## 2022-11-11 ENCOUNTER — Ambulatory Visit (HOSPITAL_COMMUNITY): Payer: Medicare HMO | Admitting: Occupational Therapy

## 2022-11-11 ENCOUNTER — Encounter (HOSPITAL_COMMUNITY): Payer: Self-pay | Admitting: Occupational Therapy

## 2022-11-11 DIAGNOSIS — M25611 Stiffness of right shoulder, not elsewhere classified: Secondary | ICD-10-CM

## 2022-11-11 DIAGNOSIS — M25511 Pain in right shoulder: Secondary | ICD-10-CM | POA: Diagnosis not present

## 2022-11-11 DIAGNOSIS — R29898 Other symptoms and signs involving the musculoskeletal system: Secondary | ICD-10-CM | POA: Diagnosis not present

## 2022-11-11 NOTE — Therapy (Signed)
OUTPATIENT OCCUPATIONAL THERAPY ORTHO TREATMENT NOTE  Patient Name: April Murillo MRN: [id number] DOB:1951/04/06, 72 y.o., female Today's Date: 11/11/2022  PCP: Lia Hopping, MD REFERRING PROVIDER: Thane Edu, MD   END OF SESSION:  OT End of Session - 11/11/22 1219     Visit Number 25    Number of Visits 30    Date for OT Re-Evaluation 12/18/22    Authorization Type [employer]    Progress Note Due on Visit 30    OT Start Time 1032    OT Stop Time 1115    OT Time Calculation (min) 43 min    Activity Tolerance Patient tolerated treatment well    Behavior During Therapy WFL for tasks assessed/performed             Past Medical History:  Diagnosis Date   Arthritis    Diabetes mellitus without complication (HCC)    Hypercholesteremia    Hypertension    Past Surgical History:  Procedure Laterality Date   CATARACT EXTRACTION W/PHACO Right 09/18/2019   Procedure: CATARACT EXTRACTION PHACO AND INTRAOCULAR LENS PLACEMENT RIGHT EYE;  Surgeon: Fabio Pierce, MD;  Location: [facility];  Service: Ophthalmology;  Laterality: Right;  CDE: 6.15   CATARACT EXTRACTION W/PHACO Left 10/02/2019   Procedure: CATARACT EXTRACTION PHACO AND INTRAOCULAR LENS PLACEMENT (IOC) CDE:;  Surgeon: Fabio Pierce, MD;  Location: [facility];  Service: Ophthalmology;  Laterality: Left;   CLOSED REDUCTION / MANIPULATION JOINT     COLONOSCOPY     REVERSE SHOULDER ARTHROPLASTY Right 06/25/2022   Procedure: REVERSE SHOULDER ARTHROPLASTY;  Surgeon: Oliver Barre, MD;  Location: [facility];  Service: Orthopedics;  Laterality: Right;   Patient Active Problem List   Diagnosis Date Noted   Closed 4-part fracture of proximal humerus 06/25/2022   Bilateral primary osteoarthritis of knee 02/02/2019   Body mass index 36.0-36.9, adult 02/02/2019   Blood in stool, frank 12/19/2018    ONSET DATE: 07/13/22  REFERRING DIAG: R Reverse Total Shoulder Surgery  THERAPY DIAG:  Acute pain of right shoulder  Shoulder  stiffness, right  Other symptoms and signs involving the musculoskeletal system  Rationale for Evaluation and Treatment: Rehabilitation  SUBJECTIVE:   SUBJECTIVE STATEMENT: S: "I think it's getting better."   PERTINENT HISTORY: Pt fell on 06/10/22 fracturing her humerus and injuring her shoulder. Overall pt has been in an immobilizer ever since.   PRECAUTIONS: Shoulder-see protocol  WEIGHT BEARING RESTRICTIONS: No  PAIN:  Are you having pain? No  FALLS: Has patient fallen in last 6 months? Yes. Number of falls 1  PATIENT GOALS: To get use of her arm back  NEXT MD VISIT: 6 weeks  OBJECTIVE:   HAND DOMINANCE: Right  ADLs: Overall ADLs: Pt requiring assist with all ADL's due to being unable to move her arm, she is requring total assist for cleaning and most cooking tasks.   FUNCTIONAL OUTCOME MEASURES: FOTO: 29.81 08/31/22: 52.09 10/06/22: 51/100 10/28/22: 59.89/100  UPPER EXTREMITY ROM:       Assessed supine, er/IR adducted  Passive ROM In Supine Right eval Right 08/31/22  Shoulder flexion 113 129  Shoulder abduction 104 139  Shoulder internal rotation 90 90  Shoulder external rotation 49 45  Elbow flexion 140   Elbow extension -22   (Blank rows = not tested)  Active ROM In Seated Right 09/24/22 Right 10/28/22  Shoulder flexion 68 110  Shoulder abduction 59 99  Shoulder internal rotation 90 90  Shoulder external rotation 36 52  Elbow flexion  137   Elbow extension 0   (Blank rows = not tested)  UPPER EXTREMITY MMT:     MMT Right eval Right 09/24/22 Right 10/28/22  Shoulder flexion  3+/5 3+/5  Shoulder abduction  3/5 3+/5  Shoulder internal rotation  4-/5 4/5  Shoulder external rotation  3/5 4-/5  (Blank rows = not tested)  OBSERVATIONS: Severe fasical restrictions along the biceps, trapezius, and axillary region.    TODAY'S TREATMENT:                                                                                                                               DATE:   11/11/22 -Manual Therapy: myofascial release and trigger point applied to the biceps, trapezius, and scapular region in order to reduce pain and fascial restrictions to improve ROM.  -A/ROM: supine, flexion, abduction, protraction, horizontal abduction, er/IR, x10 -Shoulder strengthening: Supine, 2lbs, flexion, abduction, protraction, horizontal abduction, er/IR, x10 -Ball on the Wall: flexion, abduction, x10 -A/ROM: seated, flexion, abduction, protraction, horizontal abduction, er/IR, x12 -PNF Strengthening: chest pulls, er pulls, PNF up, PNF down, green band, x10  10/28/22 -Manual Therapy: myofascial release and trigger point applied to the biceps, trapezius, and scapular region in order to reduce pain and fascial restrictions to improve ROM.  -A/ROM: seated, flexion, abduction, protraction, horizontal abduction, er/IR, x10 -Shoulder strengthening: Supine, flexion, abduction, protraction, horizontal abduction, er/IR, x10 -Stretching: er, flexion, bicep stretch 4x20"  10/26/22 -Manual Therapy: myofascial release and trigger point applied to the biceps, trapezius, and scapular region in order to reduce pain and fascial restrictions to improve ROM.  -A/ROM: seated, flexion, abduction, protraction, horizontal abduction, er/IR, x10 -Shoulder Strengthening: side lying-2lbs, flexion, protraction, er/IR, abduction, horizontal abduction, x12 -AA/ROM: seated, 5lb dowel, flexion, protraction, er/IR, abduction, horizontal abduction, x15 -Proximal shoulder exercises: paddles, criss cross, circles both directions, x12 each   PATIENT EDUCATION: Education details: PNF Strengthening Person educated: Patient Education method: Programmer, multimedia, Demonstration, and Handouts Education comprehension: verbalized understanding and returned demonstration  HOME EXERCISE PROGRAM: 3/7: Elbow and Wrist ROM, Pendulums 3/11: Table Slides 3/20: AA/ROM 4/1: Isometrics 4/3: Wall Slides 4/10: A/ROM 4/22:  Scapular strengthening 4/25: Stretching (biceps, er, doorway, flexion) 5/21: Shoulder Strengthening (dumbbells) 6/19: PNF Strengthening   GOALS: Goals reviewed with patient? Yes  SHORT TERM GOALS: Target date: 08/28/22  Pt will be provided with and educated on HEP to improve mobility in RUE required for use during ADL completion.  Goal status: IN PROGRESS  2.  Pt will increase RUE P/ROM by 40 degrees or greater to improve ability to use RUE during dressing tasks with minimal compensatory techniques.  Goal status: IN PROGRESS  3.  Pt will increase RUE strength to 3+/5 to improve ability to reach for items at waist to chest height during bathing and grooming tasks.  Goal status: IN PROGRESS  LONG TERM GOALS: Target date: 09/25/22  Pt will decrease pain in RUE to 3/10 or less to improve ability to sleep for 2+  consecutive hours without waking due to pain.  Goal status: IN PROGRESS  2.  Pt will decrease RUE fascial restrictions to min amounts or less to improve mobility required for functional reaching tasks. Goal status: IN PROGRESS  3.   Pt will increase RUE A/ROM to at least 145 for flexion/abduction, and 50 degrees er/IR to improve ability to use RUE when reaching overhead or behind back during dressing and bathing tasks.  Goal status: IN PROGRESS  4.  Pt will increase RUE strength to 4+/5 or greater to improve ability to use LUE when lifting or carrying items during meal preparation/housework/yardwork tasks. Goal status: IN PROGRESS  5.  Pt will return to highest level of function using RUE as dominant during functional task completion.  Goal status: IN PROGRESS  ASSESSMENT:  CLINICAL IMPRESSION: This session pt was able to achieve 80+% of full ROM actively in supine and 70% of full A/ROM seated. She continues to have decreased activity tolerance due to muscle fatigue, however she was able to complete each set of exercises before taking a short break. OT added PNF strengthening  this session, to continue working on her overall strength and endurance. Verbal and visual cuing provided for positioning and technique throughout session.  PERFORMANCE DEFICITS: in functional skills including ADLs, IADLs, edema, ROM, strength, pain, fascial restrictions, Gross motor control, body mechanics, and UE functional use.   PLAN:  OT FREQUENCY: 1-2x/week  OT DURATION: 6 weeks  PLANNED INTERVENTIONS: self care/ADL training, therapeutic exercise, therapeutic activity, manual therapy, scar mobilization, passive range of motion, functional mobility training, electrical stimulation, ultrasound, moist heat, cryotherapy, patient/family education, coping strategies training, and DME and/or AE instructions  CONSULTED AND AGREED WITH PLAN OF CARE: Patient  PLAN FOR NEXT SESSION: manual Therapy, continue with strengthening in supine, AA/ROM in standing, proximal shoulder strengthening   Trish Mage, OTR/L [phone] 11/11/2022, 12:20 PM

## 2022-11-11 NOTE — Patient Instructions (Signed)

## 2022-11-18 ENCOUNTER — Encounter (HOSPITAL_COMMUNITY): Payer: Self-pay | Admitting: Occupational Therapy

## 2022-11-18 ENCOUNTER — Ambulatory Visit (HOSPITAL_COMMUNITY): Payer: Medicare HMO | Admitting: Occupational Therapy

## 2022-11-18 DIAGNOSIS — R29898 Other symptoms and signs involving the musculoskeletal system: Secondary | ICD-10-CM

## 2022-11-18 DIAGNOSIS — M25511 Pain in right shoulder: Secondary | ICD-10-CM

## 2022-11-18 DIAGNOSIS — M25611 Stiffness of right shoulder, not elsewhere classified: Secondary | ICD-10-CM | POA: Diagnosis not present

## 2022-11-18 NOTE — Therapy (Signed)
OUTPATIENT OCCUPATIONAL THERAPY ORTHO TREATMENT NOTE  Patient Name: April Murillo MRN: [id number] DOB:[birth year], 72 y.o., female Today's Date: 11/18/2022  PCP: Lia Hopping, MD REFERRING PROVIDER: Thane Edu, MD   END OF SESSION:  OT End of Session - 11/18/22 1235     Visit Number 26    Number of Visits 30    Date for OT Re-Evaluation 12/18/22    Authorization Type [employer]    Progress Note Due on Visit 30    OT Start Time 1034    OT Stop Time 1116    OT Time Calculation (min) 42 min    Activity Tolerance Patient tolerated treatment well    Behavior During Therapy WFL for tasks assessed/performed              Past Medical History:  Diagnosis Date   Arthritis    Diabetes mellitus without complication (HCC)    Hypercholesteremia    Hypertension    Past Surgical History:  Procedure Laterality Date   CATARACT EXTRACTION W/PHACO Right 09/18/2019   Procedure: CATARACT EXTRACTION PHACO AND INTRAOCULAR LENS PLACEMENT RIGHT EYE;  Surgeon: Fabio Pierce, MD;  Location: [facility];  Service: Ophthalmology;  Laterality: Right;  CDE: 6.15   CATARACT EXTRACTION W/PHACO Left 10/02/2019   Procedure: CATARACT EXTRACTION PHACO AND INTRAOCULAR LENS PLACEMENT (IOC) CDE:;  Surgeon: Fabio Pierce, MD;  Location: [facility];  Service: Ophthalmology;  Laterality: Left;   CLOSED REDUCTION / MANIPULATION JOINT     COLONOSCOPY     REVERSE SHOULDER ARTHROPLASTY Right 06/25/2022   Procedure: REVERSE SHOULDER ARTHROPLASTY;  Surgeon: Oliver Barre, MD;  Location: [facility];  Service: Orthopedics;  Laterality: Right;   Patient Active Problem List   Diagnosis Date Noted   Closed 4-part fracture of proximal humerus 06/25/2022   Bilateral primary osteoarthritis of knee 02/02/2019   Body mass index 36.0-36.9, adult 02/02/2019   Blood in stool, frank 12/19/2018    ONSET DATE: 07/13/22  REFERRING DIAG: R Reverse Total Shoulder Surgery  THERAPY DIAG:  Acute pain of right shoulder  Shoulder  stiffness, right  Other symptoms and signs involving the musculoskeletal system  Rationale for Evaluation and Treatment: Rehabilitation  SUBJECTIVE:   SUBJECTIVE STATEMENT: S: "I think it's getting better."   PERTINENT HISTORY: Pt fell on 06/10/22 fracturing her humerus and injuring her shoulder. Overall pt has been in an immobilizer ever since.   PRECAUTIONS: Shoulder-see protocol  WEIGHT BEARING RESTRICTIONS: No  PAIN:  Are you having pain? No  FALLS: Has patient fallen in last 6 months? Yes. Number of falls 1  PATIENT GOALS: To get use of her arm back  NEXT MD VISIT: 6 weeks  OBJECTIVE:   HAND DOMINANCE: Right  ADLs: Overall ADLs: Pt requiring assist with all ADL's due to being unable to move her arm, she is requring total assist for cleaning and most cooking tasks.   FUNCTIONAL OUTCOME MEASURES: FOTO: 29.81 08/31/22: 52.09 10/06/22: 51/100 10/28/22: 59.89/100  UPPER EXTREMITY ROM:       Assessed supine, er/IR adducted  Passive ROM In Supine Right eval Right 08/31/22  Shoulder flexion 113 129  Shoulder abduction 104 139  Shoulder internal rotation 90 90  Shoulder external rotation 49 45  Elbow flexion 140   Elbow extension -22   (Blank rows = not tested)  Active ROM In Seated Right 09/24/22 Right 10/28/22  Shoulder flexion 68 110  Shoulder abduction 59 99  Shoulder internal rotation 90 90  Shoulder external rotation 36 52  Elbow  flexion 137   Elbow extension 0   (Blank rows = not tested)  UPPER EXTREMITY MMT:     MMT Right eval Right 09/24/22 Right 10/28/22  Shoulder flexion  3+/5 3+/5  Shoulder abduction  3/5 3+/5  Shoulder internal rotation  4-/5 4/5  Shoulder external rotation  3/5 4-/5  (Blank rows = not tested)  OBSERVATIONS: Severe fasical restrictions along the biceps, trapezius, and axillary region.    TODAY'S TREATMENT:                                                                                                                               DATE:   11/18/22 -Manual Therapy: myofascial release and trigger point applied to the biceps, trapezius, and scapular region in order to reduce pain and fascial restrictions to improve ROM.  -A/ROM: supine, flexion, abduction, protraction, horizontal abduction, er/IR, x12 -Shoulder strengthening: Supine, 2lbs, flexion, abduction, protraction, horizontal abduction, er/IR, x10 -X to V arms x10 -Goal Post Arms x10 -Shoulder strengthening: seated, 2lbs, flexion, protraction, abduction, x10 -Stretching: er behind back 2x20"  11/11/22 -Manual Therapy: myofascial release and trigger point applied to the biceps, trapezius, and scapular region in order to reduce pain and fascial restrictions to improve ROM.  -A/ROM: supine, flexion, abduction, protraction, horizontal abduction, er/IR, x10 -Shoulder strengthening: Supine, 2lbs, flexion, abduction, protraction, horizontal abduction, er/IR, x10 -Ball on the Wall: flexion, abduction, x10 -A/ROM: seated, flexion, abduction, protraction, horizontal abduction, er/IR, x12 -PNF Strengthening: chest pulls, er pulls, PNF up, PNF down, green band, x10  10/28/22 -Manual Therapy: myofascial release and trigger point applied to the biceps, trapezius, and scapular region in order to reduce pain and fascial restrictions to improve ROM.  -A/ROM: seated, flexion, abduction, protraction, horizontal abduction, er/IR, x10 -Shoulder strengthening: Supine, flexion, abduction, protraction, horizontal abduction, er/IR, x10 -Stretching: er, flexion, bicep stretch 4x20"   PATIENT EDUCATION: Education details: ER Towel stretch Person educated: Patient Education method: Explanation, Demonstration, and Handouts Education comprehension: verbalized understanding and returned demonstration  HOME EXERCISE PROGRAM: 3/7: Elbow and Wrist ROM, Pendulums 3/11: Table Slides 3/20: AA/ROM 4/1: Isometrics 4/3: Wall Slides 4/10: A/ROM 4/22: Scapular strengthening 4/25: Stretching  (biceps, er, doorway, flexion) 5/21: Shoulder Strengthening (dumbbells) 6/19: PNF Strengthening 6/26: ER Towel Stretch   GOALS: Goals reviewed with patient? Yes  SHORT TERM GOALS: Target date: 08/28/22  Pt will be provided with and educated on HEP to improve mobility in RUE required for use during ADL completion.  Goal status: IN PROGRESS  2.  Pt will increase RUE P/ROM by 40 degrees or greater to improve ability to use RUE during dressing tasks with minimal compensatory techniques.  Goal status: IN PROGRESS  3.  Pt will increase RUE strength to 3+/5 to improve ability to reach for items at waist to chest height during bathing and grooming tasks.  Goal status: IN PROGRESS  LONG TERM GOALS: Target date: 09/25/22  Pt will decrease pain in RUE to 3/10 or less to  improve ability to sleep for 2+ consecutive hours without waking due to pain.  Goal status: IN PROGRESS  2.  Pt will decrease RUE fascial restrictions to min amounts or less to improve mobility required for functional reaching tasks. Goal status: IN PROGRESS  3.   Pt will increase RUE A/ROM to at least 145 for flexion/abduction, and 50 degrees er/IR to improve ability to use RUE when reaching overhead or behind back during dressing and bathing tasks.  Goal status: IN PROGRESS  4.  Pt will increase RUE strength to 4+/5 or greater to improve ability to use LUE when lifting or carrying items during meal preparation/housework/yardwork tasks. Goal status: IN PROGRESS  5.  Pt will return to highest level of function using RUE as dominant during functional task completion.  Goal status: IN PROGRESS  ASSESSMENT:  CLINICAL IMPRESSION: Pt continuing to work on strengthening this session. She is demonstrating improved A/ROM and is able to start lifting light weights in sitting. Her movement pattern continues to be effortful and choppy, but she is reaching 75% of full A/ROM. Verbal and visual cuing provided throughout session for  positioning and technique.   PERFORMANCE DEFICITS: in functional skills including ADLs, IADLs, edema, ROM, strength, pain, fascial restrictions, Gross motor control, body mechanics, and UE functional use.   PLAN:  OT FREQUENCY: 1-2x/week  OT DURATION: 6 weeks  PLANNED INTERVENTIONS: self care/ADL training, therapeutic exercise, therapeutic activity, manual therapy, scar mobilization, passive range of motion, functional mobility training, electrical stimulation, ultrasound, moist heat, cryotherapy, patient/family education, coping strategies training, and DME and/or AE instructions  CONSULTED AND AGREED WITH PLAN OF CARE: Patient  PLAN FOR NEXT SESSION: manual Therapy, continue with strengthening in supine, AA/ROM in standing, proximal shoulder strengthening   Trish Mage, OTR/L [phone] 11/18/2022, 12:35 PM

## 2022-11-24 ENCOUNTER — Other Ambulatory Visit: Payer: Self-pay | Admitting: Orthopedic Surgery

## 2022-11-25 ENCOUNTER — Telehealth (HOSPITAL_COMMUNITY): Payer: Self-pay | Admitting: Occupational Therapy

## 2022-11-25 ENCOUNTER — Encounter (HOSPITAL_COMMUNITY): Payer: Medicare HMO | Admitting: Occupational Therapy

## 2022-11-25 NOTE — Telephone Encounter (Signed)
Pt was called and informed of her missed visit.  Reviewed No Show policy. Pt reported that she had another appointment this morning.  All questions and concerns addressed.   Trish Mage, OTR/L [facility] [phone]

## 2022-12-02 ENCOUNTER — Ambulatory Visit (HOSPITAL_COMMUNITY): Payer: Medicare HMO | Attending: Orthopedic Surgery | Admitting: Occupational Therapy

## 2022-12-02 DIAGNOSIS — M25511 Pain in right shoulder: Secondary | ICD-10-CM | POA: Insufficient documentation

## 2022-12-02 DIAGNOSIS — M25611 Stiffness of right shoulder, not elsewhere classified: Secondary | ICD-10-CM | POA: Diagnosis not present

## 2022-12-02 DIAGNOSIS — R29898 Other symptoms and signs involving the musculoskeletal system: Secondary | ICD-10-CM | POA: Insufficient documentation

## 2022-12-02 NOTE — Patient Instructions (Signed)
Complete _______ repetitions each. Complete _______ time a day.  Wall taps with theraband  With a looped elastic band around forearms/wrists and arms at a 90 angle pressed against the wall. Keeping elbows on the wall, tap right arm out to the right. Hold for 1 second. Return right arm back to neutral. Repeat with left arm.    Wall V slides with Theraband  Place a band loop around hands/forearms and face a wall. Extend both arms diagonally into a V shape on the wall. Hold this stretch for specified amount of time. Lower arms slowly back into neutral position. Repeat.       scap clocks  Tie a loop with a theraband and place around your wrists.  Stand with a wall in front of you.  Picture a clock in front of you.  Place both palms on the wall, arms straight.  While keeping the left/right hand planted, use the right/left hand to pull away and tap each number (1, 3, 5- right OR 11,9,7 left), coming back to center each time.    

## 2022-12-02 NOTE — Therapy (Signed)
OUTPATIENT OCCUPATIONAL THERAPY ORTHO TREATMENT NOTE  Patient Name: April Murillo MRN: [id number] DOB:09/30/1950, 72 y.o., female Today's Date: 12/02/2022  PCP: Lia Hopping, MD REFERRING PROVIDER: Thane Edu, MD   END OF SESSION:  OT End of Session - 12/02/22 1635     Visit Number 27    Number of Visits 30    Date for OT Re-Evaluation 12/18/22    Authorization Type [employer]    Progress Note Due on Visit 30    OT Start Time 1033    OT Stop Time 1117    OT Time Calculation (min) 44 min    Activity Tolerance Patient tolerated treatment well    Behavior During Therapy WFL for tasks assessed/performed              Past Medical History:  Diagnosis Date   Arthritis    Diabetes mellitus without complication (HCC)    Hypercholesteremia    Hypertension    Past Surgical History:  Procedure Laterality Date   CATARACT EXTRACTION W/PHACO Right 09/18/2019   Procedure: CATARACT EXTRACTION PHACO AND INTRAOCULAR LENS PLACEMENT RIGHT EYE;  Surgeon: Fabio Pierce, MD;  Location: [facility];  Service: Ophthalmology;  Laterality: Right;  CDE: 6.15   CATARACT EXTRACTION W/PHACO Left 10/02/2019   Procedure: CATARACT EXTRACTION PHACO AND INTRAOCULAR LENS PLACEMENT (IOC) CDE:;  Surgeon: Fabio Pierce, MD;  Location: [facility];  Service: Ophthalmology;  Laterality: Left;   CLOSED REDUCTION / MANIPULATION JOINT     COLONOSCOPY     REVERSE SHOULDER ARTHROPLASTY Right 06/25/2022   Procedure: REVERSE SHOULDER ARTHROPLASTY;  Surgeon: Oliver Barre, MD;  Location: [facility];  Service: Orthopedics;  Laterality: Right;   Patient Active Problem List   Diagnosis Date Noted   Closed 4-part fracture of proximal humerus 06/25/2022   Bilateral primary osteoarthritis of knee 02/02/2019   Body mass index 36.0-36.9, adult 02/02/2019   Blood in stool, frank 12/19/2018    ONSET DATE: 07/13/22  REFERRING DIAG: R Reverse Total Shoulder Surgery  THERAPY DIAG:  Acute pain of right shoulder  Shoulder  stiffness, right  Other symptoms and signs involving the musculoskeletal system  Rationale for Evaluation and Treatment: Rehabilitation  SUBJECTIVE:   SUBJECTIVE STATEMENT: S: "When can I be done with you?."   PERTINENT HISTORY: Pt fell on 06/10/22 fracturing her humerus and injuring her shoulder. Overall pt has been in an immobilizer ever since.   PRECAUTIONS: Shoulder-see protocol  WEIGHT BEARING RESTRICTIONS: No  PAIN:  Are you having pain? No  FALLS: Has patient fallen in last 6 months? Yes. Number of falls 1  PATIENT GOALS: To get use of her arm back  NEXT MD VISIT: 6 weeks  OBJECTIVE:   HAND DOMINANCE: Right  ADLs: Overall ADLs: Pt requiring assist with all ADL's due to being unable to move her arm, she is requring total assist for cleaning and most cooking tasks.   FUNCTIONAL OUTCOME MEASURES: FOTO: 29.81 08/31/22: 52.09 10/06/22: 51/100 10/28/22: 59.89/100  UPPER EXTREMITY ROM:       Assessed supine, er/IR adducted  Passive ROM In Supine Right eval Right 08/31/22  Shoulder flexion 113 129  Shoulder abduction 104 139  Shoulder internal rotation 90 90  Shoulder external rotation 49 45  Elbow flexion 140   Elbow extension -22   (Blank rows = not tested)  Active ROM In Seated Right 09/24/22 Right 10/28/22  Shoulder flexion 68 110  Shoulder abduction 59 99  Shoulder internal rotation 90 90  Shoulder external rotation 36 52  Elbow flexion 137   Elbow extension 0   (Blank rows = not tested)  UPPER EXTREMITY MMT:     MMT Right eval Right 09/24/22 Right 10/28/22  Shoulder flexion  3+/5 3+/5  Shoulder abduction  3/5 3+/5  Shoulder internal rotation  4-/5 4/5  Shoulder external rotation  3/5 4-/5  (Blank rows = not tested)  OBSERVATIONS: Severe fasical restrictions along the biceps, trapezius, and axillary region.    TODAY'S TREATMENT:                                                                                                                               DATE:   12/02/22 -Manual Therapy: myofascial release and trigger point applied to the biceps, trapezius, and scapular region in order to reduce pain and fascial restrictions to improve ROM.  -A/ROM: seated, flexion, abduction, protraction, horizontal abduction, er/IR, x12 -Shoulder strengthening: Seated, 2lbs, flexion, abduction, protraction, horizontal abduction, er/IR, x10 -X to V arms x10, 2lb dumbbells -Goal Post Arms x10, 2lb dumbbells -Loop Band Exercises: green band, wall taps, Wall ups, Wall clocks, x12 -PNF Strengthening: green band, chest pulls (chest height and head height), er pulls, PNF up, PNF down, x10  11/18/22 -Manual Therapy: myofascial release and trigger point applied to the biceps, trapezius, and scapular region in order to reduce pain and fascial restrictions to improve ROM.  -A/ROM: supine, flexion, abduction, protraction, horizontal abduction, er/IR, x12 -Shoulder strengthening: Supine, 2lbs, flexion, abduction, protraction, horizontal abduction, er/IR, x10 -X to V arms x10 -Goal Post Arms x10 -Shoulder strengthening: seated, 2lbs, flexion, protraction, abduction, x10 -Stretching: er behind back 2x20"  11/11/22 -Manual Therapy: myofascial release and trigger point applied to the biceps, trapezius, and scapular region in order to reduce pain and fascial restrictions to improve ROM.  -A/ROM: supine, flexion, abduction, protraction, horizontal abduction, er/IR, x10 -Shoulder strengthening: Supine, 2lbs, flexion, abduction, protraction, horizontal abduction, er/IR, x10 -Ball on the Wall: flexion, abduction, x10 -A/ROM: seated, flexion, abduction, protraction, horizontal abduction, er/IR, x12 -PNF Strengthening: chest pulls, er pulls, PNF up, PNF down, green band, x10   PATIENT EDUCATION: Education details: Loop Band Exercises Person educated: Patient Education method: Explanation, Demonstration, and Handouts Education comprehension: verbalized understanding and  returned demonstration  HOME EXERCISE PROGRAM: 3/7: Elbow and Wrist ROM, Pendulums 3/11: Table Slides 3/20: AA/ROM 4/1: Isometrics 4/3: Wall Slides 4/10: A/ROM 4/22: Scapular strengthening 4/25: Stretching (biceps, er, doorway, flexion) 5/21: Shoulder Strengthening (dumbbells) 6/19: PNF Strengthening 6/26: ER Towel Stretch 7/10: Loop band exercises   GOALS: Goals reviewed with patient? Yes  SHORT TERM GOALS: Target date: 08/28/22  Pt will be provided with and educated on HEP to improve mobility in RUE required for use during ADL completion.  Goal status: IN PROGRESS  2.  Pt will increase RUE P/ROM by 40 degrees or greater to improve ability to use RUE during dressing tasks with minimal compensatory techniques.  Goal status: IN PROGRESS  3.  Pt will increase RUE strength  to 3+/5 to improve ability to reach for items at waist to chest height during bathing and grooming tasks.  Goal status: IN PROGRESS  LONG TERM GOALS: Target date: 09/25/22  Pt will decrease pain in RUE to 3/10 or less to improve ability to sleep for 2+ consecutive hours without waking due to pain.  Goal status: IN PROGRESS  2.  Pt will decrease RUE fascial restrictions to min amounts or less to improve mobility required for functional reaching tasks. Goal status: IN PROGRESS  3.   Pt will increase RUE A/ROM to at least 145 for flexion/abduction, and 50 degrees er/IR to improve ability to use RUE when reaching overhead or behind back during dressing and bathing tasks.  Goal status: IN PROGRESS  4.  Pt will increase RUE strength to 4+/5 or greater to improve ability to use LUE when lifting or carrying items during meal preparation/housework/yardwork tasks. Goal status: IN PROGRESS  5.  Pt will return to highest level of function using RUE as dominant during functional task completion.  Goal status: IN PROGRESS  ASSESSMENT:  CLINICAL IMPRESSION: This session pt continuing to demonstrate incremental  improvements in ROM and strength. She is able to achieve approximately 75-80% of full ROM and tolerating 2lb dumbbells well with fair ROM. She continues to require breaks after each set of exercises due to muscle fatigue, however pain remains low. OT providing verbal and tactile cuing throughout for positioning and technique.   PERFORMANCE DEFICITS: in functional skills including ADLs, IADLs, edema, ROM, strength, pain, fascial restrictions, Gross motor control, body mechanics, and UE functional use.   PLAN:  OT FREQUENCY: 1-2x/week  OT DURATION: 6 weeks  PLANNED INTERVENTIONS: self care/ADL training, therapeutic exercise, therapeutic activity, manual therapy, scar mobilization, passive range of motion, functional mobility training, electrical stimulation, ultrasound, moist heat, cryotherapy, patient/family education, coping strategies training, and DME and/or AE instructions  CONSULTED AND AGREED WITH PLAN OF CARE: Patient  PLAN FOR NEXT SESSION: manual Therapy, continue with strengthening in supine, AA/ROM in standing, proximal shoulder strengthening   Trish Mage, OTR/L [phone] 12/02/2022, 4:38 PM

## 2022-12-09 ENCOUNTER — Ambulatory Visit (HOSPITAL_COMMUNITY): Payer: Medicare HMO | Admitting: Occupational Therapy

## 2022-12-09 ENCOUNTER — Encounter (HOSPITAL_COMMUNITY): Payer: Self-pay | Admitting: Occupational Therapy

## 2022-12-09 DIAGNOSIS — R29898 Other symptoms and signs involving the musculoskeletal system: Secondary | ICD-10-CM

## 2022-12-09 DIAGNOSIS — M25611 Stiffness of right shoulder, not elsewhere classified: Secondary | ICD-10-CM | POA: Diagnosis not present

## 2022-12-09 DIAGNOSIS — M25511 Pain in right shoulder: Secondary | ICD-10-CM | POA: Diagnosis not present

## 2022-12-09 NOTE — Therapy (Signed)
OUTPATIENT OCCUPATIONAL THERAPY ORTHO TREATMENT NOTE  Patient Name: April Murillo MRN: [id number] DOB:1951/03/30, 72 y.o., female Today's Date: 12/10/2022  PCP: Lia Hopping, MD REFERRING PROVIDER: Thane Edu, MD   END OF SESSION:  OT End of Session - 12/10/22 1216     Visit Number 28    Number of Visits 30    Date for OT Re-Evaluation 12/18/22    Authorization Type [employer]    Progress Note Due on Visit 30    OT Start Time 1038    OT Stop Time 1120    OT Time Calculation (min) 42 min    Activity Tolerance Patient tolerated treatment well    Behavior During Therapy WFL for tasks assessed/performed             Past Medical History:  Diagnosis Date   Arthritis    Diabetes mellitus without complication (HCC)    Hypercholesteremia    Hypertension    Past Surgical History:  Procedure Laterality Date   CATARACT EXTRACTION W/PHACO Right 09/18/2019   Procedure: CATARACT EXTRACTION PHACO AND INTRAOCULAR LENS PLACEMENT RIGHT EYE;  Surgeon: Fabio Pierce, MD;  Location: [facility];  Service: Ophthalmology;  Laterality: Right;  CDE: 6.15   CATARACT EXTRACTION W/PHACO Left 10/02/2019   Procedure: CATARACT EXTRACTION PHACO AND INTRAOCULAR LENS PLACEMENT (IOC) CDE:;  Surgeon: Fabio Pierce, MD;  Location: [facility];  Service: Ophthalmology;  Laterality: Left;   CLOSED REDUCTION / MANIPULATION JOINT     COLONOSCOPY     REVERSE SHOULDER ARTHROPLASTY Right 06/25/2022   Procedure: REVERSE SHOULDER ARTHROPLASTY;  Surgeon: Oliver Barre, MD;  Location: [facility];  Service: Orthopedics;  Laterality: Right;   Patient Active Problem List   Diagnosis Date Noted   Closed 4-part fracture of proximal humerus 06/25/2022   Bilateral primary osteoarthritis of knee 02/02/2019   Body mass index 36.0-36.9, adult 02/02/2019   Blood in stool, frank 12/19/2018    ONSET DATE: 07/13/22  REFERRING DIAG: R Reverse Total Shoulder Surgery  THERAPY DIAG:  Acute pain of right shoulder  Shoulder  stiffness, right  Other symptoms and signs involving the musculoskeletal system  Rationale for Evaluation and Treatment: Rehabilitation  SUBJECTIVE:   SUBJECTIVE STATEMENT: S: "Swimming has really been helping me."   PERTINENT HISTORY: Pt fell on 06/10/22 fracturing her humerus and injuring her shoulder. Overall pt has been in an immobilizer ever since.   PRECAUTIONS: Shoulder-see protocol  WEIGHT BEARING RESTRICTIONS: No  PAIN:  Are you having pain? No  FALLS: Has patient fallen in last 6 months? Yes. Number of falls 1  PATIENT GOALS: To get use of her arm back  NEXT MD VISIT: 6 weeks  OBJECTIVE:   HAND DOMINANCE: Right  ADLs: Overall ADLs: Pt requiring assist with all ADL's due to being unable to move her arm, she is requring total assist for cleaning and most cooking tasks.   FUNCTIONAL OUTCOME MEASURES: FOTO: 29.81 08/31/22: 52.09 10/06/22: 51/100 10/28/22: 59.89/100  UPPER EXTREMITY ROM:       Assessed supine, er/IR adducted  Passive ROM In Supine Right eval Right 08/31/22  Shoulder flexion 113 129  Shoulder abduction 104 139  Shoulder internal rotation 90 90  Shoulder external rotation 49 45  Elbow flexion 140   Elbow extension -22   (Blank rows = not tested)  Active ROM In Seated Right 09/24/22 Right 10/28/22  Shoulder flexion 68 110  Shoulder abduction 59 99  Shoulder internal rotation 90 90  Shoulder external rotation 36 52  Elbow  flexion 137   Elbow extension 0   (Blank rows = not tested)  UPPER EXTREMITY MMT:     MMT Right eval Right 09/24/22 Right 10/28/22  Shoulder flexion  3+/5 3+/5  Shoulder abduction  3/5 3+/5  Shoulder internal rotation  4-/5 4/5  Shoulder external rotation  3/5 4-/5  (Blank rows = not tested)  OBSERVATIONS: Severe fasical restrictions along the biceps, trapezius, and axillary region.    TODAY'S TREATMENT:                                                                                                                               DATE:   12/09/22 -Manual Therapy: myofascial release and trigger point applied to the biceps, trapezius, and scapular region in order to reduce pain and fascial restrictions to improve ROM.  -A/ROM: seated, flexion, abduction, protraction, horizontal abduction, er/IR, x12 -Shoulder strengthening: Seated, 3lbs, flexion, abduction, protraction, horizontal abduction, er/IR, x10 -X to V arms x10, 3lb dumbbells -Goal Post Arms x10, 3lb dumbbells -PNF Strengthening: green band, chest pulls (chest height and head height), er pulls, PNF up, PNF down, x10  12/02/22 -Manual Therapy: myofascial release and trigger point applied to the biceps, trapezius, and scapular region in order to reduce pain and fascial restrictions to improve ROM.  -A/ROM: seated, flexion, abduction, protraction, horizontal abduction, er/IR, x12 -Shoulder strengthening: Seated, 2lbs, flexion, abduction, protraction, horizontal abduction, er/IR, x10 -X to V arms x10, 2lb dumbbells -Goal Post Arms x10, 2lb dumbbells -Loop Band Exercises: green band, wall taps, Wall ups, Wall clocks, x12 -PNF Strengthening: green band, chest pulls (chest height and head height), er pulls, PNF up, PNF down, x10  11/18/22 -Manual Therapy: myofascial release and trigger point applied to the biceps, trapezius, and scapular region in order to reduce pain and fascial restrictions to improve ROM.  -A/ROM: supine, flexion, abduction, protraction, horizontal abduction, er/IR, x12 -Shoulder strengthening: Supine, 2lbs, flexion, abduction, protraction, horizontal abduction, er/IR, x10 -X to V arms x10 -Goal Post Arms x10 -Shoulder strengthening: seated, 2lbs, flexion, protraction, abduction, x10 -Stretching: er behind back 2x20"   PATIENT EDUCATION: Education details: Continue HEP Person educated: Patient Education method: Programmer, multimedia, Demonstration, and Handouts Education comprehension: verbalized understanding and returned  demonstration  HOME EXERCISE PROGRAM: 3/7: Elbow and Wrist ROM, Pendulums 3/11: Table Slides 3/20: AA/ROM 4/1: Isometrics 4/3: Wall Slides 4/10: A/ROM 4/22: Scapular strengthening 4/25: Stretching (biceps, er, doorway, flexion) 5/21: Shoulder Strengthening (dumbbells) 6/19: PNF Strengthening 6/26: ER Towel Stretch 7/10: Loop band exercises   GOALS: Goals reviewed with patient? Yes  SHORT TERM GOALS: Target date: 08/28/22  Pt will be provided with and educated on HEP to improve mobility in RUE required for use during ADL completion.  Goal status: IN PROGRESS  2.  Pt will increase RUE P/ROM by 40 degrees or greater to improve ability to use RUE during dressing tasks with minimal compensatory techniques.  Goal status: IN PROGRESS  3.  Pt will increase RUE strength  to 3+/5 to improve ability to reach for items at waist to chest height during bathing and grooming tasks.  Goal status: IN PROGRESS  LONG TERM GOALS: Target date: 09/25/22  Pt will decrease pain in RUE to 3/10 or less to improve ability to sleep for 2+ consecutive hours without waking due to pain.  Goal status: IN PROGRESS  2.  Pt will decrease RUE fascial restrictions to min amounts or less to improve mobility required for functional reaching tasks. Goal status: IN PROGRESS  3.   Pt will increase RUE A/ROM to at least 145 for flexion/abduction, and 50 degrees er/IR to improve ability to use RUE when reaching overhead or behind back during dressing and bathing tasks.  Goal status: IN PROGRESS  4.  Pt will increase RUE strength to 4+/5 or greater to improve ability to use LUE when lifting or carrying items during meal preparation/housework/yardwork tasks. Goal status: IN PROGRESS  5.  Pt will return to highest level of function using RUE as dominant during functional task completion.  Goal status: IN PROGRESS  ASSESSMENT:  CLINICAL IMPRESSION: Pt continuing to work on strengthening and improved mobility. She is  able to demonstrate ROM WFL and improving strength and stamina. Pt continuing to increase weights and resistance bands as her strength is progressing. OT providing verbal and visual cuing for positioning and technique.   PERFORMANCE DEFICITS: in functional skills including ADLs, IADLs, edema, ROM, strength, pain, fascial restrictions, Gross motor control, body mechanics, and UE functional use.   PLAN:  OT FREQUENCY: 1-2x/week  OT DURATION: 6 weeks  PLANNED INTERVENTIONS: self care/ADL training, therapeutic exercise, therapeutic activity, manual therapy, scar mobilization, passive range of motion, functional mobility training, electrical stimulation, ultrasound, moist heat, cryotherapy, patient/family education, coping strategies training, and DME and/or AE instructions  CONSULTED AND AGREED WITH PLAN OF CARE: Patient  PLAN FOR NEXT SESSION: manual Therapy, continue with strengthening in supine, AA/ROM in standing, proximal shoulder strengthening   Trish Mage, OTR/L [phone] 12/10/2022, 12:18 PM

## 2022-12-16 ENCOUNTER — Encounter (HOSPITAL_COMMUNITY): Payer: Self-pay | Admitting: Occupational Therapy

## 2022-12-16 ENCOUNTER — Ambulatory Visit (HOSPITAL_COMMUNITY): Payer: Medicare HMO | Admitting: Occupational Therapy

## 2022-12-16 DIAGNOSIS — R29898 Other symptoms and signs involving the musculoskeletal system: Secondary | ICD-10-CM

## 2022-12-16 DIAGNOSIS — M25511 Pain in right shoulder: Secondary | ICD-10-CM | POA: Diagnosis not present

## 2022-12-16 DIAGNOSIS — M25611 Stiffness of right shoulder, not elsewhere classified: Secondary | ICD-10-CM | POA: Diagnosis not present

## 2022-12-16 NOTE — Therapy (Signed)
OUTPATIENT OCCUPATIONAL THERAPY ORTHO TREATMENT NOTE REASSESSMENT & DISCHARGE  Patient Name: April Murillo MRN: [id number] DOB:[birth year], 72 y.o., female Today's Date: 12/16/2022  PCP: Lia Hopping, MD REFERRING PROVIDER: Thane Edu, MD   END OF SESSION:  OT End of Session - 12/16/22 1059     Visit Number 29    Number of Visits 30    Date for OT Re-Evaluation 12/18/22    Authorization Type [employer]    Progress Note Due on Visit 30    OT Start Time 1032    OT Stop Time 1059    OT Time Calculation (min) 27 min    Activity Tolerance Patient tolerated treatment well    Behavior During Therapy WFL for tasks assessed/performed              Past Medical History:  Diagnosis Date   Arthritis    Diabetes mellitus without complication (HCC)    Hypercholesteremia    Hypertension    Past Surgical History:  Procedure Laterality Date   CATARACT EXTRACTION W/PHACO Right 09/18/2019   Procedure: CATARACT EXTRACTION PHACO AND INTRAOCULAR LENS PLACEMENT RIGHT EYE;  Surgeon: Fabio Pierce, MD;  Location: [facility];  Service: Ophthalmology;  Laterality: Right;  CDE: 6.15   CATARACT EXTRACTION W/PHACO Left 10/02/2019   Procedure: CATARACT EXTRACTION PHACO AND INTRAOCULAR LENS PLACEMENT (IOC) CDE:;  Surgeon: Fabio Pierce, MD;  Location: [facility];  Service: Ophthalmology;  Laterality: Left;   CLOSED REDUCTION / MANIPULATION JOINT     COLONOSCOPY     REVERSE SHOULDER ARTHROPLASTY Right 06/25/2022   Procedure: REVERSE SHOULDER ARTHROPLASTY;  Surgeon: Oliver Barre, MD;  Location: [facility];  Service: Orthopedics;  Laterality: Right;   Patient Active Problem List   Diagnosis Date Noted   Closed 4-part fracture of proximal humerus 06/25/2022   Bilateral primary osteoarthritis of knee 02/02/2019   Body mass index 36.0-36.9, adult 02/02/2019   Blood in stool, frank 12/19/2018    ONSET DATE: 07/13/22  REFERRING DIAG: R Reverse Total Shoulder Surgery  THERAPY DIAG:  Acute pain of  right shoulder  Shoulder stiffness, right  Other symptoms and signs involving the musculoskeletal system  Rationale for Evaluation and Treatment: Rehabilitation  SUBJECTIVE:   SUBJECTIVE STATEMENT: S: "I think swimming has really helped me."   PERTINENT HISTORY: Pt fell on 06/10/22 fracturing her humerus and injuring her shoulder. Overall pt has been in an immobilizer ever since.   PRECAUTIONS: Shoulder-see protocol  WEIGHT BEARING RESTRICTIONS: No  PAIN:  Are you having pain? No  FALLS: Has patient fallen in last 6 months? Yes. Number of falls 1  PATIENT GOALS: To get use of her arm back  NEXT MD VISIT: 6 weeks  OBJECTIVE:   HAND DOMINANCE: Right  ADLs: Overall ADLs: Pt requiring assist with all ADL's due to being unable to move her arm, she is requring total assist for cleaning and most cooking tasks.   FUNCTIONAL OUTCOME MEASURES: FOTO: 29.81 08/31/22: 52.09 10/06/22: 51/100 10/28/22: 59.89/100 12/16/22: 59/100  UPPER EXTREMITY ROM:       Assessed supine, er/IR adducted  Passive ROM In Supine Right eval Right 08/31/22 Right 12/16/22  Shoulder flexion 113 129 151  Shoulder abduction 104 139 175  Shoulder internal rotation 90 90 90  Shoulder external rotation 49 45 48  Elbow flexion 140    Elbow extension -22    (Blank rows = not tested)  Active ROM In Seated Right 09/24/22 Right 10/28/22 Right 12/16/22  Shoulder flexion 68 110 134  Shoulder abduction 59 99 144  Shoulder internal rotation 90 90 90  Shoulder external rotation 36 52 45  Elbow flexion 137    Elbow extension 0    (Blank rows = not tested)  UPPER EXTREMITY MMT:     MMT Right eval Right 09/24/22 Right 10/28/22 Right 12/16/22  Shoulder flexion  3+/5 3+/5 4-/5  Shoulder abduction  3/5 3+/5 4-/5  Shoulder internal rotation  4-/5 4/5 4-/5  Shoulder external rotation  3/5 4-/5 4+/5  (Blank rows = not tested)  OBSERVATIONS: Severe fasical restrictions along the biceps, trapezius, and axillary  region.    TODAY'S TREATMENT:                                                                                                                              DATE:  12/16/22 -Shoulder strengthening: Seated, 2lbs, flexion, abduction, protraction, horizontal abduction, er/IR, x10 -X to V arms x10, 2lb dumbbells  12/09/22 -Manual Therapy: myofascial release and trigger point applied to the biceps, trapezius, and scapular region in order to reduce pain and fascial restrictions to improve ROM.  -A/ROM: seated, flexion, abduction, protraction, horizontal abduction, er/IR, x12 -Shoulder strengthening: Seated, 3lbs, flexion, abduction, protraction, horizontal abduction, er/IR, x10 -X to V arms x10, 3lb dumbbells -Goal Post Arms x10, 3lb dumbbells -PNF Strengthening: green band, chest pulls (chest height and head height), er pulls, PNF up, PNF down, x10  12/02/22 -Manual Therapy: myofascial release and trigger point applied to the biceps, trapezius, and scapular region in order to reduce pain and fascial restrictions to improve ROM.  -A/ROM: seated, flexion, abduction, protraction, horizontal abduction, er/IR, x12 -Shoulder strengthening: Seated, 2lbs, flexion, abduction, protraction, horizontal abduction, er/IR, x10 -X to V arms x10, 2lb dumbbells -Goal Post Arms x10, 2lb dumbbells -Loop Band Exercises: green band, wall taps, Wall ups, Wall clocks, x12 -PNF Strengthening: green band, chest pulls (chest height and head height), er pulls, PNF up, PNF down, x10   PATIENT EDUCATION: Education details: Continue HEP Person educated: Patient Education method: Programmer, multimedia, Demonstration, and Handouts Education comprehension: verbalized understanding and returned demonstration  HOME EXERCISE PROGRAM: 3/7: Elbow and Wrist ROM, Pendulums 3/11: Table Slides 3/20: AA/ROM 4/1: Isometrics 4/3: Wall Slides 4/10: A/ROM 4/22: Scapular strengthening 4/25: Stretching (biceps, er, doorway, flexion) 5/21:  Shoulder Strengthening (dumbbells) 6/19: PNF Strengthening 6/26: ER Towel Stretch 7/10: Loop band exercises   GOALS: Goals reviewed with patient? Yes  SHORT TERM GOALS: Target date: 08/28/22  Pt will be provided with and educated on HEP to improve mobility in RUE required for use during ADL completion.  Goal status: MET  2.  Pt will increase RUE P/ROM by 40 degrees or greater to improve ability to use RUE during dressing tasks with minimal compensatory techniques.  Goal status: MET  3.  Pt will increase RUE strength to 3+/5 to improve ability to reach for items at waist to chest height during bathing and grooming tasks.  Goal status: MET  LONG  TERM GOALS: Target date: 09/25/22  Pt will decrease pain in RUE to 3/10 or less to improve ability to sleep for 2+ consecutive hours without waking due to pain.  Goal status: MET  2.  Pt will decrease RUE fascial restrictions to min amounts or less to improve mobility required for functional reaching tasks. Goal status: MET  3.   Pt will increase RUE A/ROM to at least 145 for flexion/abduction, and 50 degrees er/IR to improve ability to use RUE when reaching overhead or behind back during dressing and bathing tasks.  Goal status: NOT MET  4.  Pt will increase RUE strength to 4+/5 or greater to improve ability to use LUE when lifting or carrying items during meal preparation/housework/yardwork tasks. Goal status: NOT MET  5.  Pt will return to highest level of function using RUE as dominant during functional task completion.  Goal status: MET  ASSESSMENT:  CLINICAL IMPRESSION: Reassessment completed this session, pt has met all STGs and 3/5 LTGs. Pt reports she uses her RUE all the time and is swimming daily. Pt demonstrates improved ROM and strength in all planes, continues to have strength and activity tolerance limitations. Continued with strengthening this session, reviewing HEP and providing verbal cuing for form and technique. Pt is  agreeable to discharge today.   PERFORMANCE DEFICITS: in functional skills including ADLs, IADLs, edema, ROM, strength, pain, fascial restrictions, Gross motor control, body mechanics, and UE functional use.   PLAN:  OT FREQUENCY: 1-2x/week  OT DURATION: 6 weeks  PLANNED INTERVENTIONS: self care/ADL training, therapeutic exercise, therapeutic activity, manual therapy, scar mobilization, passive range of motion, functional mobility training, electrical stimulation, ultrasound, moist heat, cryotherapy, patient/family education, coping strategies training, and DME and/or AE instructions  CONSULTED AND AGREED WITH PLAN OF CARE: Patient  PLAN FOR NEXT SESSION: manual Therapy, continue with strengthening in supine, AA/ROM in standing, proximal shoulder strengthening   OCCUPATIONAL THERAPY DISCHARGE SUMMARY  Visits from Start of Care: 29  Current functional level related to goals / functional outcomes: See above. Pt has met all STGs and 3/5 LTGs. Pt is pleased with the currently functional level and reports she is using her RUE daily. Pt is swimming daily as well.    Remaining deficits: Strength and activity tolerance limitations   Education / Equipment: HEP for continued strengthening   Patient agrees to discharge. Patient goals were partially met. Patient is being discharged due to being pleased with the current functional level.Marland Kitchen     Ezra Sites, OTR/L  [phone] 12/16/2022, 11:00 AM

## 2023-01-04 ENCOUNTER — Other Ambulatory Visit: Payer: Self-pay | Admitting: Orthopedic Surgery

## 2023-02-03 ENCOUNTER — Other Ambulatory Visit: Payer: Self-pay | Admitting: Orthopedic Surgery

## 2023-02-15 DIAGNOSIS — E7849 Other hyperlipidemia: Secondary | ICD-10-CM | POA: Diagnosis not present

## 2023-02-15 DIAGNOSIS — E1169 Type 2 diabetes mellitus with other specified complication: Secondary | ICD-10-CM | POA: Diagnosis not present

## 2023-02-15 DIAGNOSIS — K219 Gastro-esophageal reflux disease without esophagitis: Secondary | ICD-10-CM | POA: Diagnosis not present

## 2023-02-15 DIAGNOSIS — Z6829 Body mass index (BMI) 29.0-29.9, adult: Secondary | ICD-10-CM | POA: Diagnosis not present

## 2023-02-15 DIAGNOSIS — Z0000 Encounter for general adult medical examination without abnormal findings: Secondary | ICD-10-CM | POA: Diagnosis not present

## 2023-02-15 DIAGNOSIS — J3081 Allergic rhinitis due to animal (cat) (dog) hair and dander: Secondary | ICD-10-CM | POA: Diagnosis not present

## 2023-02-15 DIAGNOSIS — I10 Essential (primary) hypertension: Secondary | ICD-10-CM | POA: Diagnosis not present

## 2023-02-15 DIAGNOSIS — E785 Hyperlipidemia, unspecified: Secondary | ICD-10-CM | POA: Diagnosis not present

## 2023-03-30 DIAGNOSIS — Z1231 Encounter for screening mammogram for malignant neoplasm of breast: Secondary | ICD-10-CM | POA: Diagnosis not present

## 2023-04-05 DIAGNOSIS — H5203 Hypermetropia, bilateral: Secondary | ICD-10-CM | POA: Diagnosis not present

## 2023-04-29 ENCOUNTER — Other Ambulatory Visit: Payer: Self-pay | Admitting: Orthopedic Surgery

## 2023-05-24 DIAGNOSIS — J3081 Allergic rhinitis due to animal (cat) (dog) hair and dander: Secondary | ICD-10-CM | POA: Diagnosis not present

## 2023-05-24 DIAGNOSIS — Z6832 Body mass index (BMI) 32.0-32.9, adult: Secondary | ICD-10-CM | POA: Diagnosis not present

## 2023-05-24 DIAGNOSIS — K219 Gastro-esophageal reflux disease without esophagitis: Secondary | ICD-10-CM | POA: Diagnosis not present

## 2023-05-24 DIAGNOSIS — E1169 Type 2 diabetes mellitus with other specified complication: Secondary | ICD-10-CM | POA: Diagnosis not present

## 2023-05-24 DIAGNOSIS — E7849 Other hyperlipidemia: Secondary | ICD-10-CM | POA: Diagnosis not present

## 2023-05-24 DIAGNOSIS — I10 Essential (primary) hypertension: Secondary | ICD-10-CM | POA: Diagnosis not present

## 2023-06-16 ENCOUNTER — Other Ambulatory Visit: Payer: Self-pay | Admitting: Orthopedic Surgery

## 2023-08-23 DIAGNOSIS — J3081 Allergic rhinitis due to animal (cat) (dog) hair and dander: Secondary | ICD-10-CM | POA: Diagnosis not present

## 2023-08-23 DIAGNOSIS — K219 Gastro-esophageal reflux disease without esophagitis: Secondary | ICD-10-CM | POA: Diagnosis not present

## 2023-08-23 DIAGNOSIS — Z6831 Body mass index (BMI) 31.0-31.9, adult: Secondary | ICD-10-CM | POA: Diagnosis not present

## 2023-08-23 DIAGNOSIS — Z0000 Encounter for general adult medical examination without abnormal findings: Secondary | ICD-10-CM | POA: Diagnosis not present

## 2023-08-23 DIAGNOSIS — E1169 Type 2 diabetes mellitus with other specified complication: Secondary | ICD-10-CM | POA: Diagnosis not present

## 2023-08-23 DIAGNOSIS — E7849 Other hyperlipidemia: Secondary | ICD-10-CM | POA: Diagnosis not present

## 2023-08-23 DIAGNOSIS — I10 Essential (primary) hypertension: Secondary | ICD-10-CM | POA: Diagnosis not present

## 2023-09-26 ENCOUNTER — Other Ambulatory Visit: Payer: Self-pay | Admitting: Orthopedic Surgery

## 2023-09-29 DIAGNOSIS — I739 Peripheral vascular disease, unspecified: Secondary | ICD-10-CM | POA: Diagnosis not present

## 2023-11-29 DIAGNOSIS — Z0000 Encounter for general adult medical examination without abnormal findings: Secondary | ICD-10-CM | POA: Diagnosis not present

## 2023-11-29 DIAGNOSIS — Z6832 Body mass index (BMI) 32.0-32.9, adult: Secondary | ICD-10-CM | POA: Diagnosis not present

## 2023-11-29 DIAGNOSIS — E1169 Type 2 diabetes mellitus with other specified complication: Secondary | ICD-10-CM | POA: Diagnosis not present

## 2023-11-29 DIAGNOSIS — E7849 Other hyperlipidemia: Secondary | ICD-10-CM | POA: Diagnosis not present

## 2023-11-29 DIAGNOSIS — I10 Essential (primary) hypertension: Secondary | ICD-10-CM | POA: Diagnosis not present

## 2023-11-29 DIAGNOSIS — K219 Gastro-esophageal reflux disease without esophagitis: Secondary | ICD-10-CM | POA: Diagnosis not present

## 2023-11-29 DIAGNOSIS — J3081 Allergic rhinitis due to animal (cat) (dog) hair and dander: Secondary | ICD-10-CM | POA: Diagnosis not present

## 2023-12-18 ENCOUNTER — Other Ambulatory Visit: Payer: Self-pay | Admitting: Orthopedic Surgery

## 2024-01-14 ENCOUNTER — Encounter: Payer: Self-pay | Admitting: Radiology

## 2024-03-27 ENCOUNTER — Encounter: Payer: Self-pay | Admitting: Radiology

## 2024-04-03 DIAGNOSIS — I10 Essential (primary) hypertension: Secondary | ICD-10-CM | POA: Diagnosis not present

## 2024-04-03 DIAGNOSIS — J3081 Allergic rhinitis due to animal (cat) (dog) hair and dander: Secondary | ICD-10-CM | POA: Diagnosis not present

## 2024-04-03 DIAGNOSIS — K219 Gastro-esophageal reflux disease without esophagitis: Secondary | ICD-10-CM | POA: Diagnosis not present

## 2024-04-03 DIAGNOSIS — E7849 Other hyperlipidemia: Secondary | ICD-10-CM | POA: Diagnosis not present

## 2024-04-03 DIAGNOSIS — Z6832 Body mass index (BMI) 32.0-32.9, adult: Secondary | ICD-10-CM | POA: Diagnosis not present

## 2024-04-03 DIAGNOSIS — E1169 Type 2 diabetes mellitus with other specified complication: Secondary | ICD-10-CM | POA: Diagnosis not present

## 2024-04-10 DIAGNOSIS — E119 Type 2 diabetes mellitus without complications: Secondary | ICD-10-CM | POA: Diagnosis not present

## 2024-05-29 ENCOUNTER — Other Ambulatory Visit: Payer: Self-pay | Admitting: Orthopedic Surgery
